# Patient Record
Sex: Female | Born: 1960 | Race: White | Hispanic: No | Marital: Married | Smoking: Former smoker
Health system: Southern US, Community
[De-identification: ages and names within clinical notes are randomized; demographics above are authoritative.]

## PROBLEM LIST (undated history)

## (undated) DIAGNOSIS — M810 Age-related osteoporosis without current pathological fracture: Secondary | ICD-10-CM

## (undated) DIAGNOSIS — C50919 Malignant neoplasm of unspecified site of unspecified female breast: Secondary | ICD-10-CM

## (undated) DIAGNOSIS — K8020 Calculus of gallbladder without cholecystitis without obstruction: Secondary | ICD-10-CM

## (undated) DIAGNOSIS — F410 Panic disorder [episodic paroxysmal anxiety] without agoraphobia: Secondary | ICD-10-CM

## (undated) DIAGNOSIS — Z9221 Personal history of antineoplastic chemotherapy: Secondary | ICD-10-CM

## (undated) DIAGNOSIS — F419 Anxiety disorder, unspecified: Secondary | ICD-10-CM

## (undated) DIAGNOSIS — W19XXXA Unspecified fall, initial encounter: Secondary | ICD-10-CM

## (undated) DIAGNOSIS — R42 Dizziness and giddiness: Secondary | ICD-10-CM

## (undated) HISTORY — DX: Unspecified fall, initial encounter: W19.XXXA

## (undated) HISTORY — DX: Calculus of gallbladder without cholecystitis without obstruction: K80.20

## (undated) HISTORY — DX: Age-related osteoporosis without current pathological fracture: M81.0

## (undated) HISTORY — PX: OTHER SURGICAL HISTORY: SHX169

## (undated) HISTORY — DX: Malignant neoplasm of unspecified site of unspecified female breast: C50.919

## (undated) HISTORY — DX: Anxiety disorder, unspecified: F41.9

## (undated) HISTORY — PX: COLONOSCOPY W/ POLYPECTOMY: SHX1380

---

## 1999-03-14 ENCOUNTER — Other Ambulatory Visit: Admission: RE | Admit: 1999-03-14 | Discharge: 1999-03-14 | Payer: Self-pay | Admitting: Obstetrics and Gynecology

## 1999-09-25 HISTORY — PX: TUBAL LIGATION: SHX77

## 1999-10-25 ENCOUNTER — Inpatient Hospital Stay (HOSPITAL_COMMUNITY): Admission: AD | Admit: 1999-10-25 | Discharge: 1999-10-28 | Payer: Self-pay | Admitting: Family Medicine

## 1999-10-29 ENCOUNTER — Encounter: Admission: RE | Admit: 1999-10-29 | Discharge: 2000-01-27 | Payer: Self-pay | Admitting: Obstetrics and Gynecology

## 2000-02-26 ENCOUNTER — Encounter (HOSPITAL_COMMUNITY): Admission: RE | Admit: 2000-02-26 | Discharge: 2000-05-26 | Payer: Self-pay | Admitting: Obstetrics and Gynecology

## 2000-04-02 ENCOUNTER — Other Ambulatory Visit: Admission: RE | Admit: 2000-04-02 | Discharge: 2000-04-02 | Payer: Self-pay | Admitting: Obstetrics and Gynecology

## 2000-04-04 ENCOUNTER — Encounter: Payer: Self-pay | Admitting: Obstetrics and Gynecology

## 2000-04-04 ENCOUNTER — Encounter: Admission: RE | Admit: 2000-04-04 | Discharge: 2000-04-04 | Payer: Self-pay | Admitting: Obstetrics and Gynecology

## 2000-09-24 DIAGNOSIS — C50919 Malignant neoplasm of unspecified site of unspecified female breast: Secondary | ICD-10-CM

## 2000-09-24 HISTORY — PX: MASTECTOMY: SHX3

## 2000-09-24 HISTORY — PX: AUGMENTATION MAMMAPLASTY: SUR837

## 2000-09-24 HISTORY — DX: Malignant neoplasm of unspecified site of unspecified female breast: C50.919

## 2000-09-24 HISTORY — PX: OTHER SURGICAL HISTORY: SHX169

## 2000-09-24 HISTORY — PX: BREAST BIOPSY: SHX20

## 2000-09-24 HISTORY — PX: ABDOMINAL HYSTERECTOMY: SHX81

## 2000-12-30 ENCOUNTER — Encounter: Admission: RE | Admit: 2000-12-30 | Discharge: 2000-12-30 | Payer: Self-pay | Admitting: Obstetrics and Gynecology

## 2000-12-30 ENCOUNTER — Encounter: Payer: Self-pay | Admitting: Obstetrics and Gynecology

## 2000-12-30 ENCOUNTER — Other Ambulatory Visit: Admission: RE | Admit: 2000-12-30 | Discharge: 2000-12-30 | Payer: Self-pay | Admitting: Obstetrics and Gynecology

## 2001-01-06 ENCOUNTER — Encounter: Payer: Self-pay | Admitting: General Surgery

## 2001-01-07 ENCOUNTER — Ambulatory Visit (HOSPITAL_COMMUNITY): Admission: RE | Admit: 2001-01-07 | Discharge: 2001-01-08 | Payer: Self-pay | Admitting: General Surgery

## 2001-01-07 ENCOUNTER — Encounter: Admission: RE | Admit: 2001-01-07 | Discharge: 2001-01-07 | Payer: Self-pay | Admitting: General Surgery

## 2001-01-07 ENCOUNTER — Encounter: Payer: Self-pay | Admitting: General Surgery

## 2001-01-08 HISTORY — PX: BREAST LUMPECTOMY: SHX2

## 2001-01-13 ENCOUNTER — Encounter: Admission: RE | Admit: 2001-01-13 | Discharge: 2001-04-13 | Payer: Self-pay | Admitting: *Deleted

## 2001-01-18 ENCOUNTER — Emergency Department (HOSPITAL_COMMUNITY): Admission: EM | Admit: 2001-01-18 | Discharge: 2001-01-18 | Payer: Self-pay | Admitting: Emergency Medicine

## 2001-02-03 ENCOUNTER — Ambulatory Visit (HOSPITAL_COMMUNITY): Admission: RE | Admit: 2001-02-03 | Discharge: 2001-02-03 | Payer: Self-pay | Admitting: Oncology

## 2001-02-03 ENCOUNTER — Encounter: Payer: Self-pay | Admitting: Oncology

## 2001-02-04 ENCOUNTER — Other Ambulatory Visit: Admission: RE | Admit: 2001-02-04 | Discharge: 2001-02-04 | Payer: Self-pay | Admitting: Obstetrics and Gynecology

## 2001-02-10 ENCOUNTER — Encounter: Payer: Self-pay | Admitting: Oncology

## 2001-02-10 ENCOUNTER — Ambulatory Visit (HOSPITAL_COMMUNITY): Admission: RE | Admit: 2001-02-10 | Discharge: 2001-02-10 | Payer: Self-pay | Admitting: Oncology

## 2001-02-18 ENCOUNTER — Ambulatory Visit (HOSPITAL_COMMUNITY): Admission: RE | Admit: 2001-02-18 | Discharge: 2001-02-18 | Payer: Self-pay | Admitting: General Surgery

## 2001-02-18 ENCOUNTER — Encounter: Payer: Self-pay | Admitting: General Surgery

## 2001-03-05 ENCOUNTER — Encounter: Payer: Self-pay | Admitting: Oncology

## 2001-03-05 ENCOUNTER — Ambulatory Visit (HOSPITAL_COMMUNITY): Admission: RE | Admit: 2001-03-05 | Discharge: 2001-03-05 | Payer: Self-pay | Admitting: Oncology

## 2001-03-25 ENCOUNTER — Emergency Department (HOSPITAL_COMMUNITY): Admission: EM | Admit: 2001-03-25 | Discharge: 2001-03-25 | Payer: Self-pay | Admitting: Emergency Medicine

## 2001-04-08 ENCOUNTER — Encounter: Payer: Self-pay | Admitting: Oncology

## 2001-04-08 ENCOUNTER — Ambulatory Visit (HOSPITAL_COMMUNITY): Admission: RE | Admit: 2001-04-08 | Discharge: 2001-04-08 | Payer: Self-pay | Admitting: Oncology

## 2001-06-09 ENCOUNTER — Inpatient Hospital Stay (HOSPITAL_COMMUNITY): Admission: RE | Admit: 2001-06-09 | Discharge: 2001-06-11 | Payer: Self-pay | Admitting: General Surgery

## 2001-08-26 ENCOUNTER — Inpatient Hospital Stay (HOSPITAL_COMMUNITY): Admission: RE | Admit: 2001-08-26 | Discharge: 2001-08-29 | Payer: Self-pay | Admitting: Obstetrics and Gynecology

## 2001-09-24 HISTORY — PX: BREAST ENHANCEMENT SURGERY: SHX7

## 2001-12-10 ENCOUNTER — Ambulatory Visit (HOSPITAL_BASED_OUTPATIENT_CLINIC_OR_DEPARTMENT_OTHER): Admission: RE | Admit: 2001-12-10 | Discharge: 2001-12-10 | Payer: Self-pay | Admitting: Plastic Surgery

## 2002-01-29 ENCOUNTER — Encounter: Payer: Self-pay | Admitting: Obstetrics and Gynecology

## 2002-01-29 ENCOUNTER — Encounter: Admission: RE | Admit: 2002-01-29 | Discharge: 2002-01-29 | Payer: Self-pay | Admitting: Obstetrics and Gynecology

## 2002-04-27 ENCOUNTER — Encounter: Admission: RE | Admit: 2002-04-27 | Discharge: 2002-05-28 | Payer: Self-pay | Admitting: Obstetrics and Gynecology

## 2003-03-03 ENCOUNTER — Encounter: Admission: RE | Admit: 2003-03-03 | Discharge: 2003-06-01 | Payer: Self-pay | Admitting: Obstetrics and Gynecology

## 2003-12-15 ENCOUNTER — Other Ambulatory Visit: Admission: RE | Admit: 2003-12-15 | Discharge: 2003-12-15 | Payer: Self-pay | Admitting: Obstetrics and Gynecology

## 2005-02-14 ENCOUNTER — Other Ambulatory Visit: Admission: RE | Admit: 2005-02-14 | Discharge: 2005-02-14 | Payer: Self-pay | Admitting: Obstetrics and Gynecology

## 2005-03-01 ENCOUNTER — Ambulatory Visit: Payer: Self-pay | Admitting: Oncology

## 2005-03-07 ENCOUNTER — Encounter: Admission: RE | Admit: 2005-03-07 | Discharge: 2005-03-07 | Payer: Self-pay | Admitting: Oncology

## 2006-02-20 ENCOUNTER — Ambulatory Visit: Payer: Self-pay | Admitting: Oncology

## 2006-03-22 LAB — CBC WITH DIFFERENTIAL/PLATELET
BASO%: 0.8 % (ref 0.0–2.0)
Basophils Absolute: 0.1 10*3/uL (ref 0.0–0.1)
EOS%: 6.1 % (ref 0.0–7.0)
Eosinophils Absolute: 0.5 10*3/uL (ref 0.0–0.5)
HCT: 38.4 % (ref 34.8–46.6)
HGB: 13.2 g/dL (ref 11.6–15.9)
LYMPH%: 33.2 % (ref 14.0–48.0)
MCH: 30.8 pg (ref 26.0–34.0)
MCHC: 34.3 g/dL (ref 32.0–36.0)
MCV: 89.9 fL (ref 81.0–101.0)
MONO#: 0.6 10*3/uL (ref 0.1–0.9)
MONO%: 7.9 % (ref 0.0–13.0)
NEUT#: 4 10*3/uL (ref 1.5–6.5)
NEUT%: 52 % (ref 39.6–76.8)
Platelets: 335 10*3/uL (ref 145–400)
RBC: 4.27 10*6/uL (ref 3.70–5.32)
RDW: 14.2 % (ref 11.3–14.5)
WBC: 7.7 10*3/uL (ref 3.9–10.0)
lymph#: 2.5 10*3/uL (ref 0.9–3.3)

## 2006-03-22 LAB — COMPREHENSIVE METABOLIC PANEL
ALT: 10 U/L (ref 0–40)
AST: 14 U/L (ref 0–37)
Albumin: 4.7 g/dL (ref 3.5–5.2)
Alkaline Phosphatase: 73 U/L (ref 39–117)
BUN: 14 mg/dL (ref 6–23)
CO2: 28 mEq/L (ref 19–32)
Calcium: 9.5 mg/dL (ref 8.4–10.5)
Chloride: 103 mEq/L (ref 96–112)
Creatinine, Ser: 0.83 mg/dL (ref 0.40–1.20)
Glucose, Bld: 86 mg/dL (ref 70–99)
Potassium: 4.2 mEq/L (ref 3.5–5.3)
Sodium: 140 mEq/L (ref 135–145)
Total Bilirubin: 0.3 mg/dL (ref 0.3–1.2)
Total Protein: 6.7 g/dL (ref 6.0–8.3)

## 2006-04-25 ENCOUNTER — Ambulatory Visit: Payer: Self-pay | Admitting: Oncology

## 2012-02-27 ENCOUNTER — Other Ambulatory Visit: Payer: Self-pay | Admitting: Obstetrics and Gynecology

## 2012-03-31 ENCOUNTER — Encounter: Payer: Self-pay | Admitting: Gastroenterology

## 2012-04-16 ENCOUNTER — Ambulatory Visit (AMBULATORY_SURGERY_CENTER): Payer: 59 | Admitting: *Deleted

## 2012-04-16 ENCOUNTER — Encounter: Payer: Self-pay | Admitting: Gastroenterology

## 2012-04-16 VITALS — Ht 64.0 in | Wt 188.0 lb

## 2012-04-16 DIAGNOSIS — Z1211 Encounter for screening for malignant neoplasm of colon: Secondary | ICD-10-CM

## 2012-04-16 MED ORDER — MOVIPREP 100 G PO SOLR: ORAL | Status: DC

## 2012-04-30 ENCOUNTER — Encounter: Payer: Self-pay | Admitting: Gastroenterology

## 2012-04-30 ENCOUNTER — Ambulatory Visit (AMBULATORY_SURGERY_CENTER): Payer: 59 | Admitting: Gastroenterology

## 2012-04-30 VITALS — BP 165/71 | HR 72 | Temp 97.0°F | Resp 18 | Ht 64.0 in | Wt 188.0 lb

## 2012-04-30 DIAGNOSIS — D126 Benign neoplasm of colon, unspecified: Secondary | ICD-10-CM

## 2012-04-30 DIAGNOSIS — Z1211 Encounter for screening for malignant neoplasm of colon: Secondary | ICD-10-CM

## 2012-04-30 MED ORDER — SODIUM CHLORIDE 0.9 % IV SOLN: 500.0000 mL | INTRAVENOUS | Status: DC

## 2012-04-30 NOTE — Patient Instructions (Signed)
YOU HAD AN ENDOSCOPIC PROCEDURE TODAY AT THE [facility]: Refer to the procedure report that was given to you for any specific questions about what was found during the examination.  If the procedure report does not answer your questions, please call your gastroenterologist to clarify.  If you requested that your care partner not be given the details of your procedure findings, then the procedure report has been included in a sealed envelope for you to review at your convenience later.  YOU SHOULD EXPECT: Some feelings of bloating in the abdomen. Passage of more gas than usual.  Walking can help get rid of the air that was put into your GI tract during the procedure and reduce the bloating. If you had a lower endoscopy (such as a colonoscopy or flexible sigmoidoscopy) you may notice spotting of blood in your stool or on the toilet paper. If you underwent a bowel prep for your procedure, then you may not have a normal bowel movement for a few days.  DIET: Your first meal following the procedure should be a light meal and then it is ok to progress to your normal diet.  A half-sandwich or bowl of soup is an example of a good first meal.  Heavy or fried foods are harder to digest and may make you feel nauseous or bloated.  Likewise meals heavy in dairy and vegetables can cause extra gas to form and this can also increase the bloating.  Drink plenty of fluids but you should avoid alcoholic beverages for 24 hours.  ACTIVITY: Your care partner should take you home directly after the procedure.  You should plan to take it easy, moving slowly for the rest of the day.  You can resume normal activity the day after the procedure however you should NOT DRIVE or use heavy machinery for 24 hours (because of the sedation medicines used during the test).    SYMPTOMS TO REPORT IMMEDIATELY: A gastroenterologist can be reached at any hour.  During normal business hours, 8:30 AM to 5:00 PM Monday through Friday,  call [phone].  After hours and on weekends, please call the GI answering service at [phone] who will take a message and have the physician on call contact you.   Following lower endoscopy (colonoscopy or flexible sigmoidoscopy):  Excessive amounts of blood in the stool  Significant tenderness or worsening of abdominal pains  Swelling of the abdomen that is new, acute  Fever of 100F or higher    FOLLOW UP: If any biopsies were taken you will be contacted by phone or by letter within the next 1-3 weeks.  Call your gastroenterologist if you have not heard about the biopsies in 3 weeks.  Our staff will call the home number listed on your records the next business day following your procedure to check on you and address any questions or concerns that you may have at that time regarding the information given to you following your procedure. This is a courtesy call and so if there is no answer at the home number and we have not heard from you through the emergency physician on call, we will assume that you have returned to your regular daily activities without incident.  SIGNATURES/CONFIDENTIALITY: You and/or your care partner have signed paperwork which will be entered into your electronic medical record.  These signatures attest to the fact that that the information above on your After Visit Summary has been reviewed and is understood.  Full responsibility of the confidentiality   of this discharge information lies with you and/or your care-partner.    INFORMATION ON POLYPS GIVEN TO YOU TODAY.   HOLD ASPIRIN & ANT INFLAMMATORY PRODUCTS FOR 2 WEEKS

## 2012-04-30 NOTE — Op Note (Signed)
[facility] [address] [place], [place]  [address]  COLONOSCOPY PROCEDURE REPORT  PATIENT:  Connie, Ochoa  MR#:  [id number] BIRTHDATE:  1961/02/21, 51 yrs. old  GENDER:  female ENDOSCOPIST:  Judie Petit T. Russella Dar, MD, [facility] Referred by:  Miguel Aschoff, M.D. PROCEDURE DATE:  04/30/2012 PROCEDURE:  Colonoscopy with snare polypectomy ASA CLASS:  Class II INDICATIONS:  1) Routine Risk Screening MEDICATIONS:   MAC sedation, administered by CRNA, propofol (Diprivan) 500 mg IV DESCRIPTION OF PROCEDURE:   After the risks benefits and alternatives of the procedure were thoroughly explained, informed consent was obtained.  Digital rectal exam was performed and revealed no abnormalities.   The LB CF-[id number] endoscope was introduced through the anus and advanced to the cecum, which was identified by both the appendix and ileocecal valve, without limitations.  The quality of the prep was excellent, using MoviPrep.  The instrument was then slowly withdrawn as the colon was fully examined. <<PROCEDUREIMAGES>> FINDINGS:  A sessile polyp was found in the cecum. It was 10 mm in size. Polyp was snared, then cauterized with monopolar cautery. Retrieval was successful. Piecemeal polypectomy. Otherwise normal colonoscopy without other polyps, masses, vascular ectasias, or inflammatory changes.   Retroflexed views in the rectum revealed no abnormalities.  The time to cecum =  2.75  minutes. The scope was then withdrawn (time =  9  min) from the patient and the procedure completed.  COMPLICATIONS:  None  ENDOSCOPIC IMPRESSION: 1) 10 mm sessile polyp in the cecum  RECOMMENDATIONS: 1) Await pathology results 2) Hold aspirin, aspirin products, and anti-inflammatory medication for 2 weeks. 3) Repeat Colonoscopy in 1 year, if polyp is adenomatous, to assess for complete polypectomy, otherwise 10 years.  Venita Lick. Russella Dar, MD, [facility]  n. eSIGNED:   Venita Lick. Lyric Rossano at 04/30/2012 03:05  PM  Almon Hercules, [phone]

## 2012-04-30 NOTE — Progress Notes (Signed)
Patient did not experience any of the following events: a burn prior to discharge; a fall within the facility; wrong site/side/patient/procedure/implant event; or a hospital transfer or hospital admission upon discharge from the facility. (G8907) Patient did not have preoperative order for IV antibiotic SSI prophylaxis. (G8918)  

## 2012-05-01 ENCOUNTER — Telehealth: Payer: Self-pay

## 2012-05-01 NOTE — Telephone Encounter (Signed)
  Follow up Call-  Call back number 04/30/2012  Post procedure Call Back phone  # cell [phone]  Permission to leave phone message Yes     Patient questions:  Do you have a fever, pain , or abdominal swelling? no Pain Score  0 *  Have you tolerated food without any problems? yes  Have you been able to return to your normal activities? yes  Do you have any questions about your discharge instructions: Diet   no Medications  no Follow up visit  no  Do you have questions or concerns about your Care? no  Actions: * If pain score is 4 or above: No action needed, pain <4.

## 2012-05-08 ENCOUNTER — Encounter: Payer: Self-pay | Admitting: Gastroenterology

## 2013-01-20 ENCOUNTER — Telehealth: Payer: Self-pay | Admitting: *Deleted

## 2013-01-20 NOTE — Telephone Encounter (Signed)
Left message for pt to return my call so I can schedule an appt w/ Dr. Welton Flakes.

## 2013-01-23 ENCOUNTER — Telehealth: Payer: Self-pay | Admitting: *Deleted

## 2013-01-23 NOTE — Telephone Encounter (Signed)
Left message for pt to return my call so I can schedule her an appt w/ Dr. Welton Flakes.

## 2013-02-10 ENCOUNTER — Telehealth: Payer: Self-pay | Admitting: *Deleted

## 2013-02-10 NOTE — Telephone Encounter (Signed)
Left message for pt to return my call so I can schedule her an appt w/ Dr. Khan. 

## 2013-03-05 ENCOUNTER — Other Ambulatory Visit: Payer: Self-pay | Admitting: Obstetrics and Gynecology

## 2013-05-13 ENCOUNTER — Other Ambulatory Visit: Payer: Self-pay | Admitting: Hematology & Oncology

## 2013-06-03 ENCOUNTER — Encounter: Payer: Self-pay | Admitting: Gastroenterology

## 2013-09-07 ENCOUNTER — Encounter (HOSPITAL_COMMUNITY): Payer: Self-pay | Admitting: Emergency Medicine

## 2013-09-07 ENCOUNTER — Emergency Department (HOSPITAL_COMMUNITY)
Admission: EM | Admit: 2013-09-07 | Discharge: 2013-09-08 | Disposition: A | Discharge status: Home / Self Care | Payer: 59 | Attending: Emergency Medicine | Admitting: Emergency Medicine

## 2013-09-07 DIAGNOSIS — R109 Unspecified abdominal pain: Secondary | ICD-10-CM

## 2013-09-07 DIAGNOSIS — K8020 Calculus of gallbladder without cholecystitis without obstruction: Secondary | ICD-10-CM | POA: Insufficient documentation

## 2013-09-07 DIAGNOSIS — Z853 Personal history of malignant neoplasm of breast: Secondary | ICD-10-CM | POA: Insufficient documentation

## 2013-09-07 DIAGNOSIS — Z90710 Acquired absence of both cervix and uterus: Secondary | ICD-10-CM | POA: Insufficient documentation

## 2013-09-07 DIAGNOSIS — Z87891 Personal history of nicotine dependence: Secondary | ICD-10-CM | POA: Insufficient documentation

## 2013-09-07 DIAGNOSIS — Z9889 Other specified postprocedural states: Secondary | ICD-10-CM | POA: Insufficient documentation

## 2013-09-07 DIAGNOSIS — Z9851 Tubal ligation status: Secondary | ICD-10-CM | POA: Insufficient documentation

## 2013-09-07 NOTE — ED Notes (Signed)
Pt reports she began having upper abdominal pain at 1900 once she got home from work, states she ate lunch at 1300 without difficulty, pt reports abdominal distention, denies n/v/d, states she took Tums but this did not relieve the pain or pressure

## 2013-09-08 ENCOUNTER — Emergency Department (HOSPITAL_COMMUNITY): Payer: 59

## 2013-09-08 LAB — CBC WITH DIFFERENTIAL/PLATELET
Basophils Absolute: 0 10*3/uL (ref 0.0–0.1)
Basophils Relative: 0 % (ref 0–1)
Eosinophils Absolute: 0.1 10*3/uL (ref 0.0–0.7)
Eosinophils Relative: 2 % (ref 0–5)
HCT: 38.8 % (ref 36.0–46.0)
Hemoglobin: 13.1 g/dL (ref 12.0–15.0)
Lymphocytes Relative: 21 % (ref 12–46)
Lymphs Abs: 2 10*3/uL (ref 0.7–4.0)
MCH: 30.3 pg (ref 26.0–34.0)
MCHC: 33.8 g/dL (ref 30.0–36.0)
MCV: 89.6 fL (ref 78.0–100.0)
Monocytes Absolute: 0.8 10*3/uL (ref 0.1–1.0)
Monocytes Relative: 8 % (ref 3–12)
Neutro Abs: 6.5 10*3/uL (ref 1.7–7.7)
Neutrophils Relative %: 69 % (ref 43–77)
Platelets: 253 10*3/uL (ref 150–400)
RBC: 4.33 MIL/uL (ref 3.87–5.11)
RDW: 14.1 % (ref 11.5–15.5)
WBC: 9.4 10*3/uL (ref 4.0–10.5)

## 2013-09-08 LAB — URINALYSIS, ROUTINE W REFLEX MICROSCOPIC
Bilirubin Urine: NEGATIVE
Glucose, UA: NEGATIVE mg/dL
Hgb urine dipstick: NEGATIVE
Ketones, ur: NEGATIVE mg/dL
Nitrite: NEGATIVE
Protein, ur: NEGATIVE mg/dL
Specific Gravity, Urine: 1.025 (ref 1.005–1.030)
Urobilinogen, UA: 1 mg/dL (ref 0.0–1.0)
pH: 6.5 (ref 5.0–8.0)

## 2013-09-08 LAB — COMPREHENSIVE METABOLIC PANEL
ALT: 59 U/L — ABNORMAL HIGH (ref 0–35)
AST: 136 U/L — ABNORMAL HIGH (ref 0–37)
Albumin: 3.9 g/dL (ref 3.5–5.2)
Alkaline Phosphatase: 81 U/L (ref 39–117)
BUN: 11 mg/dL (ref 6–23)
CO2: 29 mEq/L (ref 19–32)
Calcium: 10.8 mg/dL — ABNORMAL HIGH (ref 8.4–10.5)
Chloride: 99 mEq/L (ref 96–112)
Creatinine, Ser: 0.79 mg/dL (ref 0.50–1.10)
GFR calc Af Amer: >90 mL/min (ref >90)
GFR calc non Af Amer: >90 mL/min (ref >90)
Glucose, Bld: 134 mg/dL — ABNORMAL HIGH (ref 70–99)
Potassium: 3.8 mEq/L (ref 3.5–5.1)
Sodium: 138 mEq/L (ref 135–145)
Total Bilirubin: 0.3 mg/dL (ref 0.3–1.2)
Total Protein: 6.7 g/dL (ref 6.0–8.3)

## 2013-09-08 LAB — URINE MICROSCOPIC-ADD ON

## 2013-09-08 LAB — LIPASE, BLOOD: Lipase: 51 U/L (ref 11–59)

## 2013-09-08 MED ORDER — HYDROCODONE-ACETAMINOPHEN 5-325 MG PO TABS
1.0000 | ORAL_TABLET | Freq: Once | ORAL | Status: AC
  Administered 2013-09-08: 1 via ORAL
  Filled 2013-09-08: qty 1

## 2013-09-08 MED ORDER — HYDROCODONE-ACETAMINOPHEN 5-325 MG PO TABS: 1.0000 | ORAL_TABLET | ORAL | Status: DC | PRN

## 2013-09-08 MED ORDER — GI COCKTAIL ~~LOC~~
30.0000 mL | Freq: Once | ORAL | Status: AC
  Administered 2013-09-08: 30 mL via ORAL
  Filled 2013-09-08: qty 30

## 2013-09-08 MED ORDER — ONDANSETRON 8 MG PO TBDP
8.0000 mg | ORAL_TABLET | Freq: Once | ORAL | Status: AC
  Administered 2013-09-08: 8 mg via ORAL
  Filled 2013-09-08: qty 1

## 2013-09-08 NOTE — ED Provider Notes (Signed)
Medical screening examination/treatment/procedure(s) were performed by non-physician practitioner and as supervising physician I was immediately available for consultation/collaboration.    Sunnie Nielsen, MD 09/08/13 [phone]

## 2013-09-08 NOTE — ED Notes (Signed)
US at bedside

## 2013-09-08 NOTE — ED Provider Notes (Signed)
CSN: [id number]     Arrival date & time 09/07/13  2238 History   First MD Initiated Contact with Patient 09/08/13 0152     Chief Complaint  Patient presents with  . Abdominal Pain   HPI  History provided by the patient and husband. Patient is a 52 year old female with previous history of breast cancer, mastectomy, C-section, hysterectomy who presents with complaints of continued and slightly worsened upper abdominal pain. Patient states she first began having some pain and discomfort in the epigastric and upper abdominal areas around 7 PM. Patient states she was returning home at the time. Penis is sore and cramping pain that radiates both to the left and right sides. She denies pain around to the back. She did return home and tried to take some TUMS without any relief of symptoms. She denies having any associated fever, chills or sweats. No nausea, vomiting or diarrhea symptoms. Her last meal was around 1 PM and she has not eaten anything since that time. Denies any similar symptoms previously. No other aggravating or alleviating factors. No other associated symptoms.   Past Medical History  Diagnosis Date  . Breast cancer 2002   Past Surgical History  Procedure Laterality Date  . Bilateral mastectomy  2002  . C section  1997 2001    x2  . Tubal ligation  2001  . Abdominal hysterectomy  2002  . Breast enhancement surgery  2003    bilateral  . Colonscopy     Family History  Problem Relation Age of Onset  . Uterine cancer Mother   . Ovarian cancer Mother   . Breast cancer Sister   . Esophageal cancer Brother   . Breast cancer Maternal Grandmother    History  Substance Use Topics  . Smoking status: Former Smoker    Quit date: 04/17/1979  . Smokeless tobacco: Not on file  . Alcohol Use: 0.6 oz/week    1 Cans of beer per week   OB History   Grav Para Term Preterm Abortions TAB SAB Ect Mult Living                 Review of Systems  Constitutional: Negative for fever,  chills and diaphoresis.  Respiratory: Negative for cough and shortness of breath.   Cardiovascular: Negative for chest pain.  Gastrointestinal: Positive for abdominal pain. Negative for nausea, vomiting, diarrhea, constipation and blood in stool.  Genitourinary: Negative for dysuria, frequency, hematuria and flank pain.  All other systems reviewed and are negative.    Allergies  Review of patient's allergies indicates no known allergies.  Home Medications  No current outpatient prescriptions on file. BP 98/73  Pulse 77  Temp(Src) 98.7 F (37.1 C) (Oral)  Resp 21  SpO2 96% Physical Exam  Nursing note and vitals reviewed. Constitutional: She is oriented to person, place, and time. She appears well-developed and well-nourished. No distress.  HENT:  Head: Normocephalic.  Cardiovascular: Normal rate and regular rhythm.   Pulmonary/Chest: Effort normal and breath sounds normal.  Abdominal: Soft.  Neurological: She is alert and oriented to person, place, and time.  Skin: Skin is warm and dry. No rash noted.  Psychiatric: She has a normal mood and affect. Her behavior is normal.    ED Course  Procedures   DIAGNOSTIC STUDIES: Oxygen Saturation is 96% on room air.    COORDINATION OF CARE:  Nursing notes reviewed. Vital signs reviewed. Initial pt interview and examination performed.   1:58 AM-patient seen and evaluated. She appears  in mild discomfort but no acute distress. He does not appear severely ill or toxic. Discussed work up plan with pt at bedside, which includes lab testing and ultrasound. Pt agrees with plan.  Ultrasound demonstrates several small gallstones. No significant gallbladder wall thickening or stranding. Patient reports having improvement of symptoms after medicine. Patient was requesting to return home. I discussed the need for her to followup with the general surgeon later today or with her primary care provider to schedule close followup and reevaluation. I  also gave strict return precautions and patient agreed.  Treatment plan initiated: Medications  gi cocktail (Maalox,Lidocaine,Donnatal) (not administered)  HYDROcodone-acetaminophen (NORCO/VICODIN) 5-325 MG per tablet 1 tablet (1 tablet Oral Given 09/08/13 0240)  ondansetron (ZOFRAN-ODT) disintegrating tablet 8 mg (8 mg Oral Given 09/08/13 0240)   Results for orders placed during the hospital encounter of 09/07/13  CBC WITH DIFFERENTIAL      Result Value Range   WBC 9.4  4.0 - 10.5 K/uL   RBC 4.33  3.87 - 5.11 MIL/uL   Hemoglobin 13.1  12.0 - 15.0 g/dL   HCT 91.4  78.2 - 95.6 %   MCV 89.6  78.0 - 100.0 fL   MCH 30.3  26.0 - 34.0 pg   MCHC 33.8  30.0 - 36.0 g/dL   RDW 21.3  08.6 - 57.8 %   Platelets 253  150 - 400 K/uL   Neutrophils Relative % 69  43 - 77 %   Neutro Abs 6.5  1.7 - 7.7 K/uL   Lymphocytes Relative 21  12 - 46 %   Lymphs Abs 2.0  0.7 - 4.0 K/uL   Monocytes Relative 8  3 - 12 %   Monocytes Absolute 0.8  0.1 - 1.0 K/uL   Eosinophils Relative 2  0 - 5 %   Eosinophils Absolute 0.1  0.0 - 0.7 K/uL   Basophils Relative 0  0 - 1 %   Basophils Absolute 0.0  0.0 - 0.1 K/uL  COMPREHENSIVE METABOLIC PANEL      Result Value Range   Sodium 138  135 - 145 mEq/L   Potassium 3.8  3.5 - 5.1 mEq/L   Chloride 99  96 - 112 mEq/L   CO2 29  19 - 32 mEq/L   Glucose, Bld 134 (*) 70 - 99 mg/dL   BUN 11  6 - 23 mg/dL   Creatinine, Ser 4.69  0.50 - 1.10 mg/dL   Calcium 62.9 (*) 8.4 - 10.5 mg/dL   Total Protein 6.7  6.0 - 8.3 g/dL   Albumin 3.9  3.5 - 5.2 g/dL   AST 528 (*) 0 - 37 U/L   ALT 59 (*) 0 - 35 U/L   Alkaline Phosphatase 81  39 - 117 U/L   Total Bilirubin 0.3  0.3 - 1.2 mg/dL   GFR calc non Af Amer >90  >90 mL/min   GFR calc Af Amer >90  >90 mL/min  LIPASE, BLOOD      Result Value Range   Lipase 51  11 - 59 U/L  URINALYSIS, ROUTINE W REFLEX MICROSCOPIC      Result Value Range   Color, Urine YELLOW  YELLOW   APPearance CLEAR  CLEAR   Specific Gravity, Urine 1.025   1.005 - 1.030   pH 6.5  5.0 - 8.0   Glucose, UA NEGATIVE  NEGATIVE mg/dL   Hgb urine dipstick NEGATIVE  NEGATIVE   Bilirubin Urine NEGATIVE  NEGATIVE   Ketones, ur NEGATIVE  NEGATIVE mg/dL   Protein, ur NEGATIVE  NEGATIVE mg/dL   Urobilinogen, UA 1.0  0.0 - 1.0 mg/dL   Nitrite NEGATIVE  NEGATIVE   Leukocytes, UA SMALL (*) NEGATIVE  URINE MICROSCOPIC-ADD ON      Result Value Range   WBC, UA 0-2  <3 WBC/hpf   Bacteria, UA FEW (*) RARE      Imaging Review US Abdomen Complete  09/08/2013   CLINICAL DATA:  Right upper quadrant pain  EXAM: ULTRASOUND ABDOMEN COMPLETE  COMPARISON:  None.  FINDINGS: Gallbladder:  Numerous small stones. 3 mm wall thickness, borderline abnormal, but no sonographic Murphy sign or gallbladder distention.  Common bile duct:  Diameter: 4 mm.  Liver:  No focal lesion identified. Within normal limits in parenchymal echogenicity.  IVC:  No abnormality visualized.  Pancreas:  Visualized portion unremarkable.  Spleen:  Size and appearance within normal limits.  Right Kidney:  Length: 10 cm. Echogenicity within normal limits. No mass or hydronephrosis visualized.  Left Kidney:  Length: 9.5 cm. Echogenicity within normal limits. No mass or hydronephrosis visualized.  Abdominal aorta:  No aneurysm visualized.  Other findings:  None.  IMPRESSION: Numerous gallstones. No focal gallbladder tenderness to suggest acute cholecystitis.   Electronically Signed   By: Tiburcio Pea M.D.   On: 09/08/2013 02:16      MDM   1. Abdominal pain   2. Cholelithiasis        Angus Seller, PA-C 09/08/13 0300

## 2013-10-09 ENCOUNTER — Other Ambulatory Visit: Payer: Self-pay | Admitting: Hematology & Oncology

## 2013-12-02 ENCOUNTER — Encounter: Payer: Self-pay | Admitting: Gastroenterology

## 2013-12-09 ENCOUNTER — Ambulatory Visit (AMBULATORY_SURGERY_CENTER): Payer: Self-pay | Admitting: *Deleted

## 2013-12-09 VITALS — Ht 64.0 in | Wt 152.0 lb

## 2013-12-09 DIAGNOSIS — Z86010 Personal history of colonic polyps: Secondary | ICD-10-CM

## 2013-12-09 MED ORDER — MOVIPREP 100 G PO SOLR: ORAL | Status: DC

## 2013-12-09 NOTE — Progress Notes (Signed)
No allergies to eggs or soy. No problems with anesthesia.  

## 2013-12-14 ENCOUNTER — Encounter: Payer: Self-pay | Admitting: Gastroenterology

## 2013-12-15 ENCOUNTER — Ambulatory Visit (INDEPENDENT_AMBULATORY_CARE_PROVIDER_SITE_OTHER): Payer: 59 | Admitting: General Surgery

## 2013-12-31 ENCOUNTER — Encounter: Payer: 59 | Admitting: Gastroenterology

## 2014-01-13 ENCOUNTER — Ambulatory Visit (INDEPENDENT_AMBULATORY_CARE_PROVIDER_SITE_OTHER): Payer: 59 | Admitting: Surgery

## 2014-01-29 ENCOUNTER — Encounter (INDEPENDENT_AMBULATORY_CARE_PROVIDER_SITE_OTHER): Payer: Self-pay | Admitting: Surgery

## 2014-01-29 ENCOUNTER — Ambulatory Visit (INDEPENDENT_AMBULATORY_CARE_PROVIDER_SITE_OTHER): Payer: 59 | Admitting: Surgery

## 2014-01-29 VITALS — BP 115/65 | HR 64 | Temp 97.9°F | Resp 16 | Ht 64.0 in | Wt 152.4 lb

## 2014-01-29 DIAGNOSIS — K8010 Calculus of gallbladder with chronic cholecystitis without obstruction: Secondary | ICD-10-CM

## 2014-01-29 MED ORDER — OXYCODONE-ACETAMINOPHEN 5-325 MG PO TABS: 1.0000 | ORAL_TABLET | ORAL | Status: DC | PRN

## 2014-01-29 NOTE — Patient Instructions (Signed)
After your colonoscopy, call our surgery schedulers at [phone] to schedule your  Gallbladder surgery.

## 2014-01-29 NOTE — Progress Notes (Signed)
Patient ID: Connie Ochoa Ochoa, female   DOB: 1961/03/25, 52 y.o.   MRN: [id number]  Chief Complaint  Patient presents with  . Cholelithiasis    HPI Connie Ochoa Ochoa is a 53 y.o. female.  PCP - Dr. Drema Ochoa Referred for evaluation of abdominal pain/ gallstones HPI This is a 53 year old female who presents with intermittent epigastric and right upper quadrant abdominal pain that has been present for about 6 months. This has become more frequent and more severe. This tends to occur after eating. This is associated with significant abdominal bloating but no nausea, vomiting, or diarrhea. The patient was evaluated in the emergency department in December of 2014 was noted to have symptomatic gallstones but no sign of cholecystitis. She tried to manage her symptoms by limiting the amount of fat in her diet. However recently her symptoms have worsened. The patient is scheduled for a followup colonoscopy by Dr. Lucio Ochoa. She had a tubular adenoma and her cecum and 2013. She is scheduled soon for another colonoscopy.  Past Medical History  Diagnosis Date  . Breast cancer 2002  . Anxiety   . Gall stones     Past Surgical History  Procedure Laterality Date  . Bilateral mastectomy  2002  . C section  1997 2001    x2  . Tubal ligation  2001  . Abdominal hysterectomy  2002  . Breast enhancement surgery  2003    bilateral  . Colonscopy    . Breast biopsy Left 2002    Family History  Problem Relation Age of Onset  . Uterine cancer Mother   . Ovarian cancer Mother   . Breast cancer Sister   . Esophageal cancer Brother   . Breast cancer Maternal Grandmother   . Colon cancer Neg Hx     Social History History  Substance Use Topics  . Smoking status: Former Smoker    Quit date: 04/17/1979  . Smokeless tobacco: Never Used  . Alcohol Use: 0.6 oz/week    1 Cans of beer per week    No Known Allergies  Current Outpatient Prescriptions  Medication Sig Dispense Refill  . ALPRAZolam  (XANAX) 0.25 MG tablet       . sertraline (ZOLOFT) 25 MG tablet       . Vitamin D, Ergocalciferol, (DRISDOL) 50000 UNITS CAPS capsule       . MOVIPREP 100 G SOLR moviprep as directed. No substitutions  1 kit  0  . oxyCODONE-acetaminophen (PERCOCET/ROXICET) 5-325 MG per tablet Take 1 tablet by mouth every 4 (four) hours as needed for severe pain.  40 tablet  0   No current facility-administered medications for this visit.    Review of Systems Review of Systems  Constitutional: Negative for fever, chills and unexpected weight change.  HENT: Negative for congestion, hearing loss, sore throat, trouble swallowing and voice change.   Eyes: Negative for visual disturbance.  Respiratory: Negative for cough and wheezing.   Cardiovascular: Negative for chest pain, palpitations and leg swelling.  Gastrointestinal: Positive for abdominal pain and abdominal distention. Negative for nausea, vomiting, diarrhea, constipation, blood in stool and anal bleeding.  Genitourinary: Negative for hematuria, vaginal bleeding and difficulty urinating.  Musculoskeletal: Negative for arthralgias.  Skin: Negative for rash and wound.  Neurological: Negative for seizures, syncope and headaches.  Hematological: Negative for adenopathy. Does not bruise/bleed easily.  Psychiatric/Behavioral: Negative for confusion.    Blood pressure 115/65, pulse 64, temperature 97.9 F (36.6 C), resp. rate 16, height '5\' 4"'  (1.626 m),  weight 152 lb 6.4 oz (69.128 kg).  Physical Exam Physical Exam WDWN in NAD HEENT:  EOMI, sclera anicteric Neck:  No masses, no thyromegaly Lungs:  CTA bilaterally; normal respiratory effort CV:  Regular rate and rhythm; no murmurs Abd:  +bowel sounds, soft, mildly tender in epigastrium and RUQ Ext:  Well-perfused; no edema Skin:  Warm, dry; no sign of jaundice  Data Reviewed CLINICAL DATA: Right upper quadrant pain  EXAM:  ULTRASOUND ABDOMEN COMPLETE  COMPARISON: None.  FINDINGS:   Gallbladder:  Numerous small stones. 3 mm wall thickness, borderline abnormal, but  no sonographic Murphy sign or gallbladder distention.  Common bile duct:  Diameter: 4 mm.  Liver:  No focal lesion identified. Within normal limits in parenchymal  echogenicity.  IVC:  No abnormality visualized.  Pancreas:  Visualized portion unremarkable.  Spleen:  Size and appearance within normal limits.  Right Kidney:  Length: 10 cm. Echogenicity within normal limits. No mass or  hydronephrosis visualized.  Left Kidney:  Length: 9.5 cm. Echogenicity within normal limits. No mass or  hydronephrosis visualized.  Abdominal aorta:  No aneurysm visualized.  Other findings:  None.  IMPRESSION:  Numerous gallstones. No focal gallbladder tenderness to suggest  acute cholecystitis.  Electronically Signed  By: Connie Ochoa Ochoa M.D.  On: 09/08/2013 02:16 Lab Results  Component Value Date   WBC 9.4 09/08/2013   HGB 13.1 09/08/2013   HCT 38.8 09/08/2013   MCV 89.6 09/08/2013   PLT 253 09/08/2013   Lab Results  Component Value Date   CREATININE 0.79 09/08/2013   BUN 11 09/08/2013   NA 138 09/08/2013   K 3.8 09/08/2013   CL 99 09/08/2013   CO2 29 09/08/2013   Lab Results  Component Value Date   ALT 59* 09/08/2013   AST 136* 09/08/2013   ALKPHOS 81 09/08/2013   BILITOT 0.3 09/08/2013     Assessment    Chronic calculus cholecystitis     Plan    I recommend that the patient proceed with her colonoscopy.  After that is complete, recommend laparoscopic cholecystectomy with intraoperative cholangiogram.  The surgical procedure has been discussed with the patient.  Potential risks, benefits, alternative treatments, and expected outcomes have been explained.  All of the patient's questions at this time have been answered.  The likelihood of reaching the patient's treatment goal is good.  The patient understand the proposed surgical procedure and wishes to proceed.  She will call back to  schedule.         Connie Ochoa Ochoa. Connie Ochoa Ochoa 01/29/2014, 12:48 PM

## 2014-03-19 ENCOUNTER — Ambulatory Visit (AMBULATORY_SURGERY_CENTER): Payer: 59 | Admitting: Gastroenterology

## 2014-03-19 ENCOUNTER — Encounter: Payer: Self-pay | Admitting: Gastroenterology

## 2014-03-19 ENCOUNTER — Encounter: Payer: 59 | Admitting: Gastroenterology

## 2014-03-19 VITALS — BP 114/70 | HR 62 | Temp 98.0°F | Resp 27 | Ht 64.0 in | Wt 152.0 lb

## 2014-03-19 DIAGNOSIS — D126 Benign neoplasm of colon, unspecified: Secondary | ICD-10-CM

## 2014-03-19 DIAGNOSIS — Z86010 Personal history of colonic polyps: Secondary | ICD-10-CM

## 2014-03-19 MED ORDER — SODIUM CHLORIDE 0.9 % IV SOLN: 500.0000 mL | INTRAVENOUS | Status: DC

## 2014-03-19 NOTE — Progress Notes (Signed)
Called to room to assist during endoscopic procedure.  Patient ID and intended procedure confirmed with present staff. Received instructions for my participation in the procedure from the performing physician.  

## 2014-03-19 NOTE — Progress Notes (Signed)
A/ox3, pleased with MAC, report to RN 

## 2014-03-19 NOTE — Patient Instructions (Signed)
YOU HAD AN ENDOSCOPIC PROCEDURE TODAY AT THE [facility]: Refer to the procedure report that was given to you for any specific questions about what was found during the examination.  If the procedure report does not answer your questions, please call your gastroenterologist to clarify.  If you requested that your care partner not be given the details of your procedure findings, then the procedure report has been included in a sealed envelope for you to review at your convenience later.  YOU SHOULD EXPECT: Some feelings of bloating in the abdomen. Passage of more gas than usual.  Walking can help get rid of the air that was put into your GI tract during the procedure and reduce the bloating. If you had a lower endoscopy (such as a colonoscopy or flexible sigmoidoscopy) you may notice spotting of blood in your stool or on the toilet paper. If you underwent a bowel prep for your procedure, then you may not have a normal bowel movement for a few days.  DIET: Your first meal following the procedure should be a light meal and then it is ok to progress to your normal diet.  A half-sandwich or bowl of soup is an example of a good first meal.  Heavy or fried foods are harder to digest and may make you feel nauseous or bloated.  Likewise meals heavy in dairy and vegetables can cause extra gas to form and this can also increase the bloating.  Drink plenty of fluids but you should avoid alcoholic beverages for 24 hours.  ACTIVITY: Your care partner should take you home directly after the procedure.  You should plan to take it easy, moving slowly for the rest of the day.  You can resume normal activity the day after the procedure however you should NOT DRIVE or use heavy machinery for 24 hours (because of the sedation medicines used during the test).    SYMPTOMS TO REPORT IMMEDIATELY: A gastroenterologist can be reached at any hour.  During normal business hours, 8:30 AM to 5:00 PM Monday through Friday,  call [phone].  After hours and on weekends, please call the GI answering service at [phone] who will take a message and have the physician on call contact you.   Following lower endoscopy (colonoscopy or flexible sigmoidoscopy):  Excessive amounts of blood in the stool  Significant tenderness or worsening of abdominal pains  Swelling of the abdomen that is new, acute  Fever of 100F or higher  FOLLOW UP: If any biopsies were taken you will be contacted by phone or by letter within the next 1-3 weeks.  Call your gastroenterologist if you have not heard about the biopsies in 3 weeks.  Our staff will call the home number listed on your records the next business day following your procedure to check on you and address any questions or concerns that you may have at that time regarding the information given to you following your procedure. This is a courtesy call and so if there is no answer at the home number and we have not heard from you through the emergency physician on call, we will assume that you have returned to your regular daily activities without incident.  SIGNATURES/CONFIDENTIALITY: You and/or your care partner have signed paperwork which will be entered into your electronic medical record.  These signatures attest to the fact that that the information above on your After Visit Summary has been reviewed and is understood.  Full responsibility of the confidentiality of this   discharge information lies with you and/or your care-partner.  HOLD ANTI-INFLAMMATORIES FOR 2 WEEKS SUCH AS ASPIRIN, NSAIDS, ALEVE, IBUPROFEN DUE TO THE CAUTERY USSED FOR REMOVAL OF YOUR POLYP.  REPEAT COLONOSCOPY IN 3 YEARS.

## 2014-03-19 NOTE — Op Note (Signed)
[facility]  [address] [place], [address]   COLONOSCOPY PROCEDURE REPORT  PATIENT: Connie, Ochoa  MR#: [id number] BIRTHDATE: [birth year] , 19  yrs. old GENDER: Female ENDOSCOPIST: Ladene Artist, MD, [facility]  PROCEDURE DATE:  03/19/2014 PROCEDURE:   Colonoscopy with snare polypectomy First Screening Colonoscopy - Avg.  risk and is 50 yrs.  old or older - No.  Prior Negative Screening - Now for repeat screening. N/A  History of Adenoma - Now for follow-up colonoscopy & has been > or = to 3 yrs.  No.  It has been less than 3 yrs since last colonoscopy.  Medical reason.  Polyps Removed Today? Yes. ASA CLASS:   Class II INDICATIONS:Patient's personal history of adenomatous colon polyps, piecemeal polypectomy in 2013. MEDICATIONS: MAC sedation, administered by CRNA and propofol (Diprivan) 450mg  IV DESCRIPTION OF PROCEDURE:   After the risks benefits and alternatives of the procedure were thoroughly explained, informed consent was obtained.  A digital rectal exam revealed no abnormalities of the rectum.   The LB [id number]  endoscope was introduced through the anus and advanced to the cecum, which was identified by both the appendix and ileocecal valve. No adverse events experienced.   The quality of the prep was excellent, using MoviPrep  The instrument was then slowly withdrawn as the colon was fully examined.  COLON FINDINGS: A sessile polyp measuring 6 mm in size was found at the cecum.  A polypectomy was performed using snare cautery.  The resection was complete and the polyp tissue was completely retrieved.   The colon was otherwise normal.  There was no diverticulosis, inflammation, polyps or cancers unless previously stated.  Retroflexed views revealed no abnormalities. The time to cecum=4 minutes 56 seconds.  Withdrawal time=9 minutes 22 seconds. The scope was withdrawn and the procedure completed. COMPLICATIONS: There were no  complications.  ENDOSCOPIC IMPRESSION: 1.   Sessile polyp measuring 6 mm at the cecum; polypectomy performed using snare cautery 2.   The colon was otherwise normal  RECOMMENDATIONS: 1.  Hold aspirin, aspirin products, and anti-inflammatory medication for 2 weeks. 2.  Await pathology results 3.  Repeat Colonoscopy in 3 years.  eSigned:  Ladene Artist, MD, [facility] 03/19/2014 10:57 AM   cc: Leighton Ruff, MD

## 2014-03-19 NOTE — Progress Notes (Signed)
Dr. Fuller Plan made aware of pt's food intake on 03-18-14

## 2014-03-22 ENCOUNTER — Telehealth: Payer: Self-pay | Admitting: *Deleted

## 2014-03-22 NOTE — Telephone Encounter (Signed)
  Follow up Call-  Call back number 03/19/2014 04/30/2012  Post procedure Call Back phone  # [phone] cell [phone]  Permission to leave phone message Yes Yes     No answer, left message.

## 2014-03-28 ENCOUNTER — Encounter: Payer: Self-pay | Admitting: Gastroenterology

## 2014-07-07 ENCOUNTER — Other Ambulatory Visit (INDEPENDENT_AMBULATORY_CARE_PROVIDER_SITE_OTHER): Payer: Self-pay | Admitting: Surgery

## 2014-07-07 ENCOUNTER — Ambulatory Visit (INDEPENDENT_AMBULATORY_CARE_PROVIDER_SITE_OTHER): Payer: Self-pay | Admitting: Surgery

## 2014-07-07 NOTE — H&P (Signed)
Chief Complaint   Patient presents with   .  Cholelithiasis   HPI  Connie Ochoa is a 53 y.o. female. PCP - Dr. Drema [place]  Referred for evaluation of abdominal pain/ gallstones  HPI  This is a 53 year old female who presents with intermittent epigastric and right upper quadrant abdominal pain that has been present for about 6 months. This has become more frequent and more severe. This tends to occur after eating. This is associated with significant abdominal bloating but no nausea, vomiting, or diarrhea. The patient was evaluated in the emergency department in December of 2014 was noted to have symptomatic gallstones but no sign of cholecystitis. She tried to manage her symptoms by limiting the amount of fat in her diet. However recently her symptoms have worsened. The patient is scheduled for a followup colonoscopy by Dr. Lucio Edward. She had a tubular adenoma and her cecum and 2013. She is scheduled soon for another colonoscopy.  Past Medical History   Diagnosis  Date   .  Breast cancer  2002   .  Anxiety    .  Gall stones     Past Surgical History   Procedure  Laterality  Date   .  Bilateral mastectomy   2002   .  C section   1997 2001     x2   .  Tubal ligation   2001   .  Abdominal hysterectomy   2002   .  Breast enhancement surgery   2003     bilateral   .  Colonscopy     .  Breast biopsy  Left  2002    Family History   Problem  Relation  Age of Onset   .  Uterine cancer  Mother    .  Ovarian cancer  Mother    .  Breast cancer  Sister    .  Esophageal cancer  Brother    .  Breast cancer  Maternal Grandmother    .  Colon cancer  Neg Hx    Social History  History   Substance Use Topics   .  Smoking status:  Former Smoker     Quit date:  04/17/1979   .  Smokeless tobacco:  Never Used   .  Alcohol Use:  0.6 oz/week     1 Cans of beer per week   No Known Allergies  Current Outpatient Prescriptions   Medication  Sig  Dispense  Refill   .  ALPRAZolam (XANAX) 0.25 MG  tablet      .  sertraline (ZOLOFT) 25 MG tablet      .  Vitamin D, Ergocalciferol, (DRISDOL) 50000 UNITS CAPS capsule      .  MOVIPREP 100 G SOLR  moviprep as directed. No substitutions  1 kit  0   .  oxyCODONE-acetaminophen (PERCOCET/ROXICET) 5-325 MG per tablet  Take 1 tablet by mouth every 4 (four) hours as needed for severe pain.  40 tablet  0    No current facility-administered medications for this visit.   Review of Systems  Review of Systems  Constitutional: Negative for fever, chills and unexpected weight change.  HENT: Negative for congestion, hearing loss, sore throat, trouble swallowing and voice change.  Eyes: Negative for visual disturbance.  Respiratory: Negative for cough and wheezing.  Cardiovascular: Negative for chest pain, palpitations and leg swelling.  Gastrointestinal: Positive for abdominal pain and abdominal distention. Negative for nausea, vomiting, diarrhea, constipation, blood in stool and anal bleeding.  Genitourinary: Negative for hematuria, vaginal bleeding and difficulty urinating.  Musculoskeletal: Negative for arthralgias.  Skin: Negative for rash and wound.  Neurological: Negative for seizures, syncope and headaches.  Hematological: Negative for adenopathy. Does not bruise/bleed easily.  Psychiatric/Behavioral: Negative for confusion.  Blood pressure 115/65, pulse 64, temperature 97.9 F (36.6 C), resp. rate 16, height _0  (1.626 m), weight 152 lb 6.4 oz (69.128 kg).  Physical Exam  Physical Exam  WDWN in NAD  HEENT: EOMI, sclera anicteric  Neck: No masses, no thyromegaly  Lungs: CTA bilaterally; normal respiratory effort  CV: Regular rate and rhythm; no murmurs  Abd: +bowel sounds, soft, mildly tender in epigastrium and RUQ  Ext: Well-perfused; no edema  Skin: Warm, dry; no sign of jaundice  Data Reviewed  CLINICAL DATA: Right upper quadrant pain  EXAM:  ULTRASOUND ABDOMEN COMPLETE  COMPARISON: None.  FINDINGS:  Gallbladder:  Numerous small  stones. 3 mm wall thickness, borderline abnormal, but  no sonographic Murphy sign or gallbladder distention.  Common bile duct:  Diameter: 4 mm.  Liver:  No focal lesion identified. Within normal limits in parenchymal  echogenicity.  IVC:  No abnormality visualized.  Pancreas:  Visualized portion unremarkable.  Spleen:  Size and appearance within normal limits.  Right Kidney:  Length: 10 cm. Echogenicity within normal limits. No mass or  hydronephrosis visualized.  Left Kidney:  Length: 9.5 cm. Echogenicity within normal limits. No mass or  hydronephrosis visualized.  Abdominal aorta:  No aneurysm visualized.  Other findings:  None.  IMPRESSION:  Numerous gallstones. No focal gallbladder tenderness to suggest  acute cholecystitis.  Electronically Signed  By: Jorje Guild M.D.  On: 09/08/2013 02:16  Lab Results   Component  Value  Date    WBC  9.4  09/08/2013    HGB  13.1  09/08/2013    HCT  38.8  09/08/2013    MCV  89.6  09/08/2013    PLT  253  09/08/2013    Lab Results   Component  Value  Date    CREATININE  0.79  09/08/2013    BUN  11  09/08/2013    NA  138  09/08/2013    K  3.8  09/08/2013    CL  99  09/08/2013    CO2  29  09/08/2013    Lab Results   Component  Value  Date    ALT  59*  09/08/2013    AST  136*  09/08/2013    ALKPHOS  81  09/08/2013    BILITOT  0.3  09/08/2013   Assessment  Chronic calculus cholecystitis  Plan  I recommend that the patient proceed with her colonoscopy. After that is complete, recommend laparoscopic cholecystectomy with intraoperative cholangiogram. The surgical procedure has been discussed with the patient. Potential risks, benefits, alternative treatments, and expected outcomes have been explained. All of the patient's questions at this time have been answered. The likelihood of reaching the patient's treatment goal is good. The patient understand the proposed surgical procedure and wishes to proceed.    Imogene Burn. Georgette Dover,  MD, [facility]  General/ Trauma Surgery  07/07/2014 9:23 PM

## 2014-07-14 ENCOUNTER — Encounter (HOSPITAL_COMMUNITY): Payer: Self-pay | Admitting: Pharmacy Technician

## 2014-07-19 ENCOUNTER — Encounter (HOSPITAL_COMMUNITY): Payer: Self-pay | Admitting: Pharmacy Technician

## 2014-07-21 ENCOUNTER — Encounter (HOSPITAL_COMMUNITY)
Admission: RE | Admit: 2014-07-21 | Discharge: 2014-07-21 | Disposition: A | Discharge status: Home / Self Care | Payer: 59 | Source: Ambulatory Visit | Attending: Surgery | Admitting: Surgery

## 2014-07-21 ENCOUNTER — Encounter (HOSPITAL_COMMUNITY): Payer: Self-pay

## 2014-07-21 DIAGNOSIS — Z01812 Encounter for preprocedural laboratory examination: Secondary | ICD-10-CM | POA: Diagnosis present

## 2014-07-21 HISTORY — DX: Dizziness and giddiness: R42

## 2014-07-21 HISTORY — DX: Panic disorder (episodic paroxysmal anxiety): F41.0

## 2014-07-21 LAB — CBC
HCT: 40.2 % (ref 36.0–46.0)
Hemoglobin: 13.6 g/dL (ref 12.0–15.0)
MCH: 30.6 pg (ref 26.0–34.0)
MCHC: 33.8 g/dL (ref 30.0–36.0)
MCV: 90.5 fL (ref 78.0–100.0)
Platelets: 294 10*3/uL (ref 150–400)
RBC: 4.44 MIL/uL (ref 3.87–5.11)
RDW: 13.3 % (ref 11.5–15.5)
WBC: 6.4 10*3/uL (ref 4.0–10.5)

## 2014-07-27 MED ORDER — CHLORHEXIDINE GLUCONATE 4 % EX LIQD
1.0000 "application " | Freq: Once | CUTANEOUS | Status: DC
  Filled 2014-07-27: qty 15

## 2014-07-27 MED ORDER — CEFAZOLIN SODIUM-DEXTROSE 2-3 GM-% IV SOLR
2.0000 g | INTRAVENOUS | Status: AC
  Administered 2014-07-28: 2 g via INTRAVENOUS
  Filled 2014-07-27: qty 50

## 2014-07-28 ENCOUNTER — Encounter (HOSPITAL_COMMUNITY): Payer: Self-pay | Admitting: *Deleted

## 2014-07-28 ENCOUNTER — Ambulatory Visit (HOSPITAL_COMMUNITY): Payer: 59

## 2014-07-28 ENCOUNTER — Ambulatory Visit (HOSPITAL_COMMUNITY): Payer: 59 | Admitting: Anesthesiology

## 2014-07-28 ENCOUNTER — Ambulatory Visit (HOSPITAL_COMMUNITY)
Admission: RE | Admit: 2014-07-28 | Discharge: 2014-07-28 | Disposition: A | Discharge status: Home / Self Care | Payer: 59 | Source: Ambulatory Visit | Attending: Surgery | Admitting: Surgery

## 2014-07-28 ENCOUNTER — Encounter (HOSPITAL_COMMUNITY)
Admission: RE | Disposition: A | Discharge status: Home / Self Care | Payer: Self-pay | Source: Ambulatory Visit | Attending: Surgery

## 2014-07-28 DIAGNOSIS — Z853 Personal history of malignant neoplasm of breast: Secondary | ICD-10-CM | POA: Diagnosis not present

## 2014-07-28 DIAGNOSIS — Z87891 Personal history of nicotine dependence: Secondary | ICD-10-CM | POA: Diagnosis not present

## 2014-07-28 DIAGNOSIS — K8020 Calculus of gallbladder without cholecystitis without obstruction: Secondary | ICD-10-CM

## 2014-07-28 DIAGNOSIS — K8010 Calculus of gallbladder with chronic cholecystitis without obstruction: Secondary | ICD-10-CM | POA: Insufficient documentation

## 2014-07-28 DIAGNOSIS — K660 Peritoneal adhesions (postprocedural) (postinfection): Secondary | ICD-10-CM | POA: Diagnosis not present

## 2014-07-28 DIAGNOSIS — Z86010 Personal history of colonic polyps: Secondary | ICD-10-CM | POA: Insufficient documentation

## 2014-07-28 DIAGNOSIS — K8018 Calculus of gallbladder with other cholecystitis without obstruction: Secondary | ICD-10-CM | POA: Diagnosis present

## 2014-07-28 DIAGNOSIS — F419 Anxiety disorder, unspecified: Secondary | ICD-10-CM | POA: Diagnosis not present

## 2014-07-28 HISTORY — PX: CHOLECYSTECTOMY: SHX55

## 2014-07-28 SURGERY — LAPAROSCOPIC CHOLECYSTECTOMY WITH INTRAOPERATIVE CHOLANGIOGRAM
Anesthesia: General | Site: Abdomen

## 2014-07-28 MED ORDER — MIDAZOLAM HCL 2 MG/2ML IJ SOLN
INTRAMUSCULAR | Status: AC
  Filled 2014-07-28: qty 2

## 2014-07-28 MED ORDER — FENTANYL CITRATE 0.05 MG/ML IJ SOLN
INTRAMUSCULAR | Status: DC | PRN
  Administered 2014-07-28: 100 ug via INTRAVENOUS
  Administered 2014-07-28: 50 ug via INTRAVENOUS

## 2014-07-28 MED ORDER — BUPIVACAINE-EPINEPHRINE (PF) 0.25% -1:200000 IJ SOLN
INTRAMUSCULAR | Status: AC
  Filled 2014-07-28: qty 30

## 2014-07-28 MED ORDER — MEPERIDINE HCL 25 MG/ML IJ SOLN
INTRAMUSCULAR | Status: AC
  Filled 2014-07-28: qty 1

## 2014-07-28 MED ORDER — MORPHINE SULFATE 2 MG/ML IJ SOLN: 2.0000 mg | INTRAMUSCULAR | Status: DC | PRN

## 2014-07-28 MED ORDER — GLYCOPYRROLATE 0.2 MG/ML IJ SOLN
INTRAMUSCULAR | Status: DC | PRN
  Administered 2014-07-28: 0.2 mg via INTRAVENOUS
  Administered 2014-07-28: 0.4 mg via INTRAVENOUS

## 2014-07-28 MED ORDER — FENTANYL CITRATE 0.05 MG/ML IJ SOLN
25.0000 ug | INTRAMUSCULAR | Status: DC | PRN
  Administered 2014-07-28 (×2): 50 ug via INTRAVENOUS

## 2014-07-28 MED ORDER — PHENYLEPHRINE HCL 10 MG/ML IJ SOLN
INTRAMUSCULAR | Status: DC | PRN
  Administered 2014-07-28: 80 ug via INTRAVENOUS

## 2014-07-28 MED ORDER — ONDANSETRON HCL 4 MG/2ML IJ SOLN
INTRAMUSCULAR | Status: DC | PRN
  Administered 2014-07-28: 4 mg via INTRAVENOUS

## 2014-07-28 MED ORDER — ROCURONIUM BROMIDE 50 MG/5ML IV SOLN
INTRAVENOUS | Status: AC
  Filled 2014-07-28: qty 1

## 2014-07-28 MED ORDER — SODIUM CHLORIDE 0.9 % IR SOLN
Status: DC | PRN
  Administered 2014-07-28: 1000 mL

## 2014-07-28 MED ORDER — ONDANSETRON HCL 4 MG/2ML IJ SOLN
INTRAMUSCULAR | Status: AC
  Administered 2014-07-28: 4 mg via INTRAVENOUS
  Filled 2014-07-28: qty 2

## 2014-07-28 MED ORDER — LACTATED RINGERS IV SOLN
INTRAVENOUS | Status: DC | PRN
  Administered 2014-07-28 (×2): via INTRAVENOUS

## 2014-07-28 MED ORDER — EPHEDRINE SULFATE 50 MG/ML IJ SOLN
INTRAMUSCULAR | Status: AC
  Filled 2014-07-28: qty 1

## 2014-07-28 MED ORDER — FENTANYL CITRATE 0.05 MG/ML IJ SOLN
INTRAMUSCULAR | Status: AC
  Filled 2014-07-28: qty 5

## 2014-07-28 MED ORDER — OXYCODONE-ACETAMINOPHEN 5-325 MG PO TABS
1.0000 | ORAL_TABLET | ORAL | Status: DC | PRN
  Administered 2014-07-28: 1 via ORAL
  Filled 2014-07-28: qty 1

## 2014-07-28 MED ORDER — OXYCODONE-ACETAMINOPHEN 5-325 MG PO TABS
ORAL_TABLET | ORAL | Status: AC
  Administered 2014-07-28: 1 via ORAL
  Filled 2014-07-28: qty 1

## 2014-07-28 MED ORDER — FENTANYL CITRATE 0.05 MG/ML IJ SOLN
INTRAMUSCULAR | Status: AC
  Filled 2014-07-28: qty 2

## 2014-07-28 MED ORDER — GLYCOPYRROLATE 0.2 MG/ML IJ SOLN
INTRAMUSCULAR | Status: AC
  Filled 2014-07-28: qty 2

## 2014-07-28 MED ORDER — PROMETHAZINE HCL 25 MG/ML IJ SOLN: 6.2500 mg | INTRAMUSCULAR | Status: DC | PRN

## 2014-07-28 MED ORDER — PHENYLEPHRINE 40 MCG/ML (10ML) SYRINGE FOR IV PUSH (FOR BLOOD PRESSURE SUPPORT)
PREFILLED_SYRINGE | INTRAVENOUS | Status: AC
  Filled 2014-07-28: qty 10

## 2014-07-28 MED ORDER — ONDANSETRON HCL 4 MG/2ML IJ SOLN
INTRAMUSCULAR | Status: AC
  Filled 2014-07-28: qty 2

## 2014-07-28 MED ORDER — DEXAMETHASONE SODIUM PHOSPHATE 4 MG/ML IJ SOLN
INTRAMUSCULAR | Status: AC
  Filled 2014-07-28: qty 1

## 2014-07-28 MED ORDER — SODIUM CHLORIDE 0.9 % IV SOLN
INTRAVENOUS | Status: DC | PRN
  Administered 2014-07-28: 50 mL

## 2014-07-28 MED ORDER — ONDANSETRON HCL 4 MG/2ML IJ SOLN
4.0000 mg | INTRAMUSCULAR | Status: DC | PRN
  Administered 2014-07-28: 4 mg via INTRAVENOUS
  Filled 2014-07-28 (×2): qty 2

## 2014-07-28 MED ORDER — LIDOCAINE HCL (CARDIAC) 20 MG/ML IV SOLN
INTRAVENOUS | Status: DC | PRN
  Administered 2014-07-28: 100 mg via INTRAVENOUS

## 2014-07-28 MED ORDER — OXYCODONE-ACETAMINOPHEN 5-325 MG PO TABS
1.0000 | ORAL_TABLET | ORAL | Status: DC | PRN
Start: 2014-07-28 — End: 2020-10-03

## 2014-07-28 MED ORDER — DEXAMETHASONE SODIUM PHOSPHATE 4 MG/ML IJ SOLN
INTRAMUSCULAR | Status: DC | PRN
  Administered 2014-07-28: 4 mg via INTRAVENOUS

## 2014-07-28 MED ORDER — PROPOFOL 10 MG/ML IV BOLUS
INTRAVENOUS | Status: DC | PRN
  Administered 2014-07-28: 150 mg via INTRAVENOUS

## 2014-07-28 MED ORDER — BUPIVACAINE-EPINEPHRINE 0.25% -1:200000 IJ SOLN
INTRAMUSCULAR | Status: DC | PRN
  Administered 2014-07-28: 30 mL

## 2014-07-28 MED ORDER — SODIUM CHLORIDE 0.9 % IJ SOLN
INTRAMUSCULAR | Status: AC
  Filled 2014-07-28: qty 10

## 2014-07-28 MED ORDER — MIDAZOLAM HCL 5 MG/5ML IJ SOLN
INTRAMUSCULAR | Status: DC | PRN
  Administered 2014-07-28: 2 mg via INTRAVENOUS

## 2014-07-28 MED ORDER — LIDOCAINE HCL (CARDIAC) 20 MG/ML IV SOLN
INTRAVENOUS | Status: AC
  Filled 2014-07-28: qty 5

## 2014-07-28 MED ORDER — 0.9 % SODIUM CHLORIDE (POUR BTL) OPTIME
TOPICAL | Status: DC | PRN
  Administered 2014-07-28: 1000 mL

## 2014-07-28 MED ORDER — NEOSTIGMINE METHYLSULFATE 10 MG/10ML IV SOLN
INTRAVENOUS | Status: DC | PRN
Start: 2014-07-28 — End: 2014-07-28
  Administered 2014-07-28: 3 mg via INTRAVENOUS

## 2014-07-28 MED ORDER — MEPERIDINE HCL 25 MG/ML IJ SOLN
6.2500 mg | INTRAMUSCULAR | Status: DC | PRN
  Administered 2014-07-28: 6.25 mg via INTRAVENOUS

## 2014-07-28 MED ORDER — PROPOFOL 10 MG/ML IV BOLUS
INTRAVENOUS | Status: AC
  Filled 2014-07-28: qty 20

## 2014-07-28 MED ORDER — SUCCINYLCHOLINE CHLORIDE 20 MG/ML IJ SOLN
INTRAMUSCULAR | Status: AC
  Filled 2014-07-28: qty 1

## 2014-07-28 MED ORDER — NEOSTIGMINE METHYLSULFATE 10 MG/10ML IV SOLN
INTRAVENOUS | Status: AC
  Filled 2014-07-28: qty 1

## 2014-07-28 MED ORDER — ROCURONIUM BROMIDE 100 MG/10ML IV SOLN
INTRAVENOUS | Status: DC | PRN
  Administered 2014-07-28: 40 mg via INTRAVENOUS

## 2014-07-28 SURGICAL SUPPLY — 48 items
APL SKNCLS STERI-STRIP NONHPOA (GAUZE/BANDAGES/DRESSINGS) ×1
APPLIER CLIP ROT 10 11.4 M/L (STAPLE) ×2
APR CLP MED LRG 11.4X10 (STAPLE) ×1
BAG SPEC RTRVL LRG 6X4 10 (ENDOMECHANICALS) ×1
BENZOIN TINCTURE PRP APPL 2/3 (GAUZE/BANDAGES/DRESSINGS) ×2 IMPLANT
CANISTER SUCTION 2500CC (MISCELLANEOUS) ×2 IMPLANT
CHLORAPREP W/TINT 26ML (MISCELLANEOUS) ×2 IMPLANT
CLIP APPLIE ROT 10 11.4 M/L (STAPLE) ×1 IMPLANT
COVER MAYO STAND STRL (DRAPES) ×2 IMPLANT
COVER SURGICAL LIGHT HANDLE (MISCELLANEOUS) ×2 IMPLANT
DRAPE C-ARM 42X72 X-RAY (DRAPES) ×2 IMPLANT
DRAPE LAPAROSCOPIC ABDOMINAL (DRAPES) ×2 IMPLANT
DRAPE UTILITY 15X26 W/TAPE STR (DRAPE) ×4 IMPLANT
DRSG TEGADERM 2-3/8X2-3/4 SM (GAUZE/BANDAGES/DRESSINGS) ×8 IMPLANT
DRSG TEGADERM 4X4.75 (GAUZE/BANDAGES/DRESSINGS) ×2 IMPLANT
ELECT REM PT RETURN 9FT ADLT (ELECTROSURGICAL) ×2
ELECTRODE REM PT RTRN 9FT ADLT (ELECTROSURGICAL) ×1 IMPLANT
FILTER SMOKE EVAC LAPAROSHD (FILTER) ×2 IMPLANT
GAUZE SPONGE 2X2 8PLY STRL LF (GAUZE/BANDAGES/DRESSINGS) ×1 IMPLANT
GLOVE BIO SURGEON STRL SZ 6.5 (GLOVE) ×2 IMPLANT
GLOVE BIO SURGEON STRL SZ7 (GLOVE) ×4 IMPLANT
GLOVE BIOGEL PI IND STRL 7.0 (GLOVE) ×3 IMPLANT
GLOVE BIOGEL PI IND STRL 7.5 (GLOVE) ×1 IMPLANT
GLOVE BIOGEL PI INDICATOR 7.0 (GLOVE) ×3
GLOVE BIOGEL PI INDICATOR 7.5 (GLOVE) ×1
GLOVE SURG SS PI 6.5 STRL IVOR (GLOVE) ×2 IMPLANT
GOWN STRL REUS W/ TWL LRG LVL3 (GOWN DISPOSABLE) ×4 IMPLANT
GOWN STRL REUS W/TWL LRG LVL3 (GOWN DISPOSABLE) ×8
KIT BASIN OR (CUSTOM PROCEDURE TRAY) ×2 IMPLANT
KIT ROOM TURNOVER OR (KITS) ×2 IMPLANT
NS IRRIG 1000ML POUR BTL (IV SOLUTION) ×2 IMPLANT
PAD ARMBOARD 7.5X6 YLW CONV (MISCELLANEOUS) ×2 IMPLANT
POUCH SPECIMEN RETRIEVAL 10MM (ENDOMECHANICALS) ×2 IMPLANT
SCISSORS LAP 5X35 DISP (ENDOMECHANICALS) ×2 IMPLANT
SET CHOLANGIOGRAPH 5 50 .035 (SET/KITS/TRAYS/PACK) ×2 IMPLANT
SET IRRIG TUBING LAPAROSCOPIC (IRRIGATION / IRRIGATOR) ×2 IMPLANT
SLEEVE ENDOPATH XCEL 5M (ENDOMECHANICALS) ×2 IMPLANT
SPECIMEN JAR SMALL (MISCELLANEOUS) ×2 IMPLANT
SPONGE GAUZE 2X2 STER 10/PKG (GAUZE/BANDAGES/DRESSINGS) ×1
STRIP CLOSURE SKIN 1/2X4 (GAUZE/BANDAGES/DRESSINGS) ×2 IMPLANT
SUT MNCRL AB 4-0 PS2 18 (SUTURE) ×2 IMPLANT
TOWEL OR 17X24 6PK STRL BLUE (TOWEL DISPOSABLE) ×2 IMPLANT
TOWEL OR 17X26 10 PK STRL BLUE (TOWEL DISPOSABLE) ×2 IMPLANT
TRAY LAPAROSCOPIC (CUSTOM PROCEDURE TRAY) ×2 IMPLANT
TROCAR XCEL BLUNT TIP 100MML (ENDOMECHANICALS) ×2 IMPLANT
TROCAR XCEL NON-BLD 11X100MML (ENDOMECHANICALS) ×2 IMPLANT
TROCAR XCEL NON-BLD 5MMX100MML (ENDOMECHANICALS) ×2 IMPLANT
TUBING INSUFFLATION (TUBING) ×2 IMPLANT

## 2014-07-28 NOTE — Progress Notes (Signed)
Report given to maria rn as caregiver 

## 2014-07-28 NOTE — Anesthesia Procedure Notes (Signed)
Procedure Name: Intubation Date/Time: 07/28/2014 8:32 AM Performed by: Julian Reil Pre-anesthesia Checklist: Patient identified, Emergency Drugs available, Suction available and Patient being monitored Patient Re-evaluated:Patient Re-evaluated prior to inductionOxygen Delivery Method: Circle system utilized Preoxygenation: Pre-oxygenation with 100% oxygen Intubation Type: IV induction Ventilation: Mask ventilation without difficulty Laryngoscope Size: Mac and 3 Grade View: Grade I Tube type: Oral Tube size: 7.5 mm Number of attempts: 1 Airway Equipment and Method: Stylet Placement Confirmation: ETT inserted through vocal cords under direct vision,  positive ETCO2 and breath sounds checked- equal and bilateral Secured at: 21 cm Tube secured with: Tape Dental Injury: Teeth and Oropharynx as per pre-operative assessment

## 2014-07-28 NOTE — Op Note (Signed)
Laparoscopic Cholecystectomy with IOC Procedure Note  Indications: This patient presents with symptomatic gallbladder disease and will undergo laparoscopic cholecystectomy.  Pre-operative Diagnosis: Calculus of gallbladder with other cholecystitis, without mention of obstruction  Post-operative Diagnosis: Same  Surgeon: Cipriano Millikan K.   Assistants: none  Anesthesia: General endotracheal anesthesia  ASA Class: 1  Procedure Details  The patient was seen again in the Holding Room. The risks, benefits, complications, treatment options, and expected outcomes were discussed with the patient. The possibilities of reaction to medication, pulmonary aspiration, perforation of viscus, bleeding, recurrent infection, finding a normal gallbladder, the need for additional procedures, failure to diagnose a condition, the possible need to convert to an open procedure, and creating a complication requiring transfusion or operation were discussed with the patient. The likelihood of improving the patient's symptoms with return to their baseline status is good.  The patient and/or family concurred with the proposed plan, giving informed consent. The site of surgery properly noted. The patient was taken to Operating Room, identified as Connie Ochoa and the procedure verified as Laparoscopic Cholecystectomy with Intraoperative Cholangiogram. A Time Out was held and the above information confirmed.  Prior to the induction of general anesthesia, antibiotic prophylaxis was administered. General endotracheal anesthesia was then administered and tolerated well. After the induction, the abdomen was prepped with Chloraprep and draped in the sterile fashion. The patient was positioned in the supine position.  Local anesthetic agent was injected into the skin near the umbilicus and an incision made. We dissected down to the abdominal fascia with blunt dissection.  The fascia was incised vertically and we entered the  peritoneal cavity bluntly.  A pursestring suture of 0-Vicryl was placed around the fascial opening.  The Hasson cannula was inserted and secured with the stay suture.  Pneumoperitoneum was then created with CO2 and tolerated well without any adverse changes in the patient's vital signs. An 11-mm port was placed in the subxiphoid position.  Two 5-mm ports were placed in the right upper quadrant. All skin incisions were infiltrated with a local anesthetic agent before making the incision and placing the trocars.   We positioned the patient in reverse Trendelenburg, tilted slightly to the patient's left.  The gallbladder was identified, the fundus grasped and retracted cephalad. There were significant omental adhesions to the fundus of the gallbladder.  Adhesions were lysed bluntly and with the electrocautery where indicated, taking care not to injure any adjacent organs or viscus. The infundibulum was grasped and retracted laterally, exposing the peritoneum overlying the triangle of Calot. This was then divided and exposed in a blunt fashion. A critical view of the cystic duct and cystic artery was obtained.  The cystic duct was clearly identified and bluntly dissected circumferentially. The cystic duct was ligated with a clip distally.   An incision was made in the cystic duct and the [facility] cholangiogram catheter introduced. The catheter was secured using a clip. A cholangiogram was then obtained which showed good visualization of the distal and proximal biliary tree with no sign of filling defects or obstruction.  Contrast flowed easily into the duodenum. The catheter was then removed.   The cystic duct was then ligated with clips and divided. The cystic artery was identified, dissected free, ligated with clips and divided as well.   The gallbladder was dissected from the liver bed in retrograde fashion with the electrocautery. A small amount of bile was spilled, but no stones were noted.  The gallbladder was  removed and placed in an  Endocatch sac. The liver bed was irrigated and inspected. Hemostasis was achieved with the electrocautery. Copious irrigation was utilized and was repeatedly aspirated until clear.  The gallbladder and Endocatch sac were then removed through the umbilical port site.  The pursestring suture was used to close the umbilical fascia.    We again inspected the right upper quadrant for hemostasis.  Pneumoperitoneum was released as we removed the trocars.  4-0 Monocryl was used to close the skin.   Benzoin, steri-strips, and clean dressings were applied. The patient was then extubated and brought to the recovery room in stable condition. Instrument, sponge, and needle counts were correct at closure and at the conclusion of the case.   Findings: Cholecystitis with Cholelithiasis  Estimated Blood Loss: Minimal         Drains: none         Specimens: Gallbladder           Complications: None; patient tolerated the procedure well.         Disposition: PACU - hemodynamically stable.         Condition: stable   Imogene Burn. Georgette Dover, MD, [facility]  General/ Trauma Surgery  07/28/2014 9:33 AM

## 2014-07-28 NOTE — Anesthesia Preprocedure Evaluation (Addendum)
Anesthesia Evaluation  Patient identified by MRN, date of birth, ID band Patient awake    Reviewed: Allergy & Precautions, H&P , NPO status , Patient's Chart, lab work & pertinent test results  Airway Mallampati: II       Dental  (+) Teeth Intact   Pulmonary former smoker (quit 1980),  breath sounds clear to auscultation        Cardiovascular negative cardio ROS  Rhythm:Regular Rate:Normal     Neuro/Psych Anxiety    GI/Hepatic negative GI ROS, Neg liver ROS,   Endo/Other  negative endocrine ROS  Renal/GU negative Renal ROS     Musculoskeletal   Abdominal (+)  Abdomen: soft.    Peds  Hematology negative hematology ROS (+)   Anesthesia Other Findings   Reproductive/Obstetrics                           Anesthesia Physical Anesthesia Plan  ASA: II  Anesthesia Plan: General   Post-op Pain Management:    Induction: Intravenous  Airway Management Planned: Oral ETT  Additional Equipment:   Intra-op Plan:   Post-operative Plan: Extubation in OR  Informed Consent: I have reviewed the patients History and Physical, chart, labs and discussed the procedure including the risks, benefits and alternatives for the proposed anesthesia with the patient or authorized representative who has indicated his/her understanding and acceptance.     Plan Discussed with:   Anesthesia Plan Comments:         Anesthesia Quick Evaluation

## 2014-07-28 NOTE — Interval H&P Note (Signed)
History and Physical Interval Note:  07/28/2014 7:45 AM  Connie Ochoa  has presented today for surgery, with the diagnosis of Caluculus Cholecystits  The various methods of treatment have been discussed with the patient and family. After consideration of risks, benefits and other options for treatment, the patient has consented to  Procedure(s): LAPAROSCOPIC CHOLECYSTECTOMY WITH INTRAOPERATIVE CHOLANGIOGRAM (N/A) as a surgical intervention .  The patient's history has been reviewed, patient examined, no change in status, stable for surgery.  I have reviewed the patient's chart and labs.  Questions were answered to the patient's satisfaction.     Filip Luten K.

## 2014-07-28 NOTE — Transfer of Care (Signed)
Immediate Anesthesia Transfer of Care Note  Patient: Connie Ochoa  Procedure(s) Performed: Procedure(s): LAPAROSCOPIC CHOLECYSTECTOMY WITH INTRAOPERATIVE CHOLANGIOGRAM (N/A)  Patient Location: PACU  Anesthesia Type:General  Level of Consciousness: awake, alert , oriented and patient cooperative  Airway & Oxygen Therapy: Patient Spontanous Breathing and Patient connected to nasal cannula oxygen  Post-op Assessment: Report given to PACU RN, Post -op Vital signs reviewed and stable and Patient moving all extremities  Post vital signs: Reviewed and stable  Complications: No apparent anesthesia complications

## 2014-07-28 NOTE — Anesthesia Postprocedure Evaluation (Signed)
  Anesthesia Post-op Note  Patient: Connie Ochoa  Procedure(s) Performed: Procedure(s): LAPAROSCOPIC CHOLECYSTECTOMY WITH INTRAOPERATIVE CHOLANGIOGRAM (N/A)  Patient Location: PACU  Anesthesia Type:General  Level of Consciousness: awake and alert   Airway and Oxygen Therapy: Patient Spontanous Breathing and Patient connected to nasal cannula oxygen  Post-op Pain: mild  Post-op Assessment: Post-op Vital signs reviewed, Patient's Cardiovascular Status Stable, Patent Airway and No signs of Nausea or vomiting  Post-op Vital Signs: Reviewed and stable  Last Vitals:  Filed Vitals:   07/28/14 1006  BP: 124/76  Pulse: 66  Temp:   Resp: 12    Complications: No apparent anesthesia complications

## 2014-07-28 NOTE — Discharge Instructions (Signed)
[facility] °LAPAROSCOPIC SURGERY: POST OP INSTRUCTIONS °Always review your discharge instruction sheet given to you by the facility where your surgery was performed. °IF YOU HAVE DISABILITY OR FAMILY LEAVE FORMS, YOU MUST BRING THEM TO THE OFFICE FOR PROCESSING.   °DO NOT GIVE THEM TO YOUR DOCTOR. ° °1. A prescription for pain medication will be given to you upon discharge.  Take your pain medication as prescribed, if needed.  If narcotic pain medicine is not needed, then you may take acetaminophen (Tylenol) or ibuprofen (Advil) as needed. °2. Take your usually prescribed medications unless otherwise directed. °3. If you need a refill on your pain medication, please contact your pharmacy.  They will contact our office to request authorization. Prescriptions will not be filled after 5pm or on week-ends. °4. You should follow a light diet the first few days after arrival home, such as soup and crackers, etc.  Be sure to include lots of fluids daily. °5. Most patients will experience some swelling and bruising in the area of the incisions.  Ice packs will help.  Swelling and bruising can take several days to resolve.  °6. It is common to experience some constipation if taking pain medication after surgery.  Increasing fluid intake and taking a stool softener (such as Colace) will usually help or prevent this problem from occurring.  A mild laxative (Milk of Magnesia or Miralax) should be taken according to package instructions if there are no bowel movements after 48 hours. °7. Unless discharge instructions indicate otherwise, you may remove your bandages 48 hours after surgery, and you may shower at that time.  You will have steri-strips (small skin tapes) in place directly over the incision.  These strips should be left on the skin for 7-10 days.  If your surgeon used skin glue on the incision, you may shower in 24 hours.  The glue will flake off over the next 2-3 weeks.  Any sutures or staples  will be removed at the office during your follow-up visit. °8. ACTIVITIES:  You may resume regular (light) daily activities beginning the next day--such as daily self-care, walking, climbing stairs--gradually increasing activities as tolerated.  You may have sexual intercourse when it is comfortable.  Refrain from any heavy lifting or straining until approved by your doctor. °a. You may drive when you are no longer taking prescription pain medication, you can comfortably wear a seatbelt, and you can safely maneuver your car and apply brakes. °b. RETURN TO WORK:   2-3 weeks °9. You should see your doctor in the office for a follow-up appointment approximately 2-3 weeks after your surgery.  Make sure that you call for this appointment within a day or two after you arrive home to insure a convenient appointment time. °10. OTHER INSTRUCTIONS: ________________________________________________________________________ °WHEN TO CALL YOUR DOCTOR: °1. Fever over 101.0 °2. Inability to urinate °3. Continued bleeding from incision. °4. Increased pain, redness, or drainage from the incision. °5. Increasing abdominal pain ° °The clinic staff is available to answer your questions during regular business hours.  Please don’t hesitate to call and ask to speak to one of the nurses for clinical concerns.  If you have a medical emergency, go to the nearest emergency room or call 911.  A surgeon from [facility] is always on call at the hospital. °[address], [address], [place], [place]  [address] ? [address] [address], [place], [place]   [address] °[phone] ? [phone] ? FAX [phone] °Web site:   [email or web address] ° °

## 2014-07-28 NOTE — H&P (View-Only) (Signed)
Chief Complaint   Patient presents with   .  Cholelithiasis   HPI  Connie Ochoa is a 53 y.o. female. PCP - Dr. Drema Dallas  Referred for evaluation of abdominal pain/ gallstones  HPI  This is a 53 year old female who presents with intermittent epigastric and right upper quadrant abdominal pain that has been present for about 6 months. This has become more frequent and more severe. This tends to occur after eating. This is associated with significant abdominal bloating but no nausea, vomiting, or diarrhea. The patient was evaluated in the emergency department in December of 2014 was noted to have symptomatic gallstones but no sign of cholecystitis. She tried to manage her symptoms by limiting the amount of fat in her diet. However recently her symptoms have worsened. The patient is scheduled for a followup colonoscopy by Dr. Lucio Edward. She had a tubular adenoma and her cecum and 2013. She is scheduled soon for another colonoscopy.  Past Medical History   Diagnosis  Date   .  Breast cancer  2002   .  Anxiety    .  Gall stones     Past Surgical History   Procedure  Laterality  Date   .  Bilateral mastectomy   2002   .  C section   1997 2001     x2   .  Tubal ligation   2001   .  Abdominal hysterectomy   2002   .  Breast enhancement surgery   2003     bilateral   .  Colonscopy     .  Breast biopsy  Left  2002    Family History   Problem  Relation  Age of Onset   .  Uterine cancer  Mother    .  Ovarian cancer  Mother    .  Breast cancer  Sister    .  Esophageal cancer  Brother    .  Breast cancer  Maternal Grandmother    .  Colon cancer  Neg Hx    Social History  History   Substance Use Topics   .  Smoking status:  Former Smoker     Quit date:  04/17/1979   .  Smokeless tobacco:  Never Used   .  Alcohol Use:  0.6 oz/week     1 Cans of beer per week   No Known Allergies  Current Outpatient Prescriptions   Medication  Sig  Dispense  Refill   .  ALPRAZolam (XANAX) 0.25 MG  tablet      .  sertraline (ZOLOFT) 25 MG tablet      .  Vitamin D, Ergocalciferol, (DRISDOL) 50000 UNITS CAPS capsule      .  MOVIPREP 100 G SOLR  moviprep as directed. No substitutions  1 kit  0   .  oxyCODONE-acetaminophen (PERCOCET/ROXICET) 5-325 MG per tablet  Take 1 tablet by mouth every 4 (four) hours as needed for severe pain.  40 tablet  0    No current facility-administered medications for this visit.   Review of Systems  Review of Systems  Constitutional: Negative for fever, chills and unexpected weight change.  HENT: Negative for congestion, hearing loss, sore throat, trouble swallowing and voice change.  Eyes: Negative for visual disturbance.  Respiratory: Negative for cough and wheezing.  Cardiovascular: Negative for chest pain, palpitations and leg swelling.  Gastrointestinal: Positive for abdominal pain and abdominal distention. Negative for nausea, vomiting, diarrhea, constipation, blood in stool and anal bleeding.  Genitourinary: Negative for hematuria, vaginal bleeding and difficulty urinating.  Musculoskeletal: Negative for arthralgias.  Skin: Negative for rash and wound.  Neurological: Negative for seizures, syncope and headaches.  Hematological: Negative for adenopathy. Does not bruise/bleed easily.  Psychiatric/Behavioral: Negative for confusion.  Blood pressure 115/65, pulse 64, temperature 97.9 F (36.6 C), resp. rate 16, height _0  (1.626 m), weight 152 lb 6.4 oz (69.128 kg).  Physical Exam  Physical Exam  WDWN in NAD  HEENT: EOMI, sclera anicteric  Neck: No masses, no thyromegaly  Lungs: CTA bilaterally; normal respiratory effort  CV: Regular rate and rhythm; no murmurs  Abd: +bowel sounds, soft, mildly tender in epigastrium and RUQ  Ext: Well-perfused; no edema  Skin: Warm, dry; no sign of jaundice  Data Reviewed  CLINICAL DATA: Right upper quadrant pain  EXAM:  ULTRASOUND ABDOMEN COMPLETE  COMPARISON: None.  FINDINGS:  Gallbladder:  Numerous small  stones. 3 mm wall thickness, borderline abnormal, but  no sonographic Murphy sign or gallbladder distention.  Common bile duct:  Diameter: 4 mm.  Liver:  No focal lesion identified. Within normal limits in parenchymal  echogenicity.  IVC:  No abnormality visualized.  Pancreas:  Visualized portion unremarkable.  Spleen:  Size and appearance within normal limits.  Right Kidney:  Length: 10 cm. Echogenicity within normal limits. No mass or  hydronephrosis visualized.  Left Kidney:  Length: 9.5 cm. Echogenicity within normal limits. No mass or  hydronephrosis visualized.  Abdominal aorta:  No aneurysm visualized.  Other findings:  None.  IMPRESSION:  Numerous gallstones. No focal gallbladder tenderness to suggest  acute cholecystitis.  Electronically Signed  By: Jorje Guild M.D.  On: 09/08/2013 02:16  Lab Results   Component  Value  Date    WBC  9.4  09/08/2013    HGB  13.1  09/08/2013    HCT  38.8  09/08/2013    MCV  89.6  09/08/2013    PLT  253  09/08/2013    Lab Results   Component  Value  Date    CREATININE  0.79  09/08/2013    BUN  11  09/08/2013    NA  138  09/08/2013    K  3.8  09/08/2013    CL  99  09/08/2013    CO2  29  09/08/2013    Lab Results   Component  Value  Date    ALT  59*  09/08/2013    AST  136*  09/08/2013    ALKPHOS  81  09/08/2013    BILITOT  0.3  09/08/2013   Assessment  Chronic calculus cholecystitis  Plan  I recommend that the patient proceed with her colonoscopy. After that is complete, recommend laparoscopic cholecystectomy with intraoperative cholangiogram. The surgical procedure has been discussed with the patient. Potential risks, benefits, alternative treatments, and expected outcomes have been explained. All of the patient's questions at this time have been answered. The likelihood of reaching the patient's treatment goal is good. The patient understand the proposed surgical procedure and wishes to proceed.    Imogene Burn. Georgette Dover,  MD, [facility]  General/ Trauma Surgery  07/07/2014 9:23 PM

## 2014-07-29 ENCOUNTER — Encounter (HOSPITAL_COMMUNITY): Payer: Self-pay | Admitting: Surgery

## 2015-10-24 IMAGING — US US ABDOMEN COMPLETE
1 series · 14 of 25 positions shown · non-contrast
Comparison: None.

CLINICAL DATA: Right upper quadrant pain

EXAM:
ULTRASOUND ABDOMEN COMPLETE

[Series 1: us abdomen complete · 0.24mm/px · 14 of 104 slices shown]
[im 1/104]
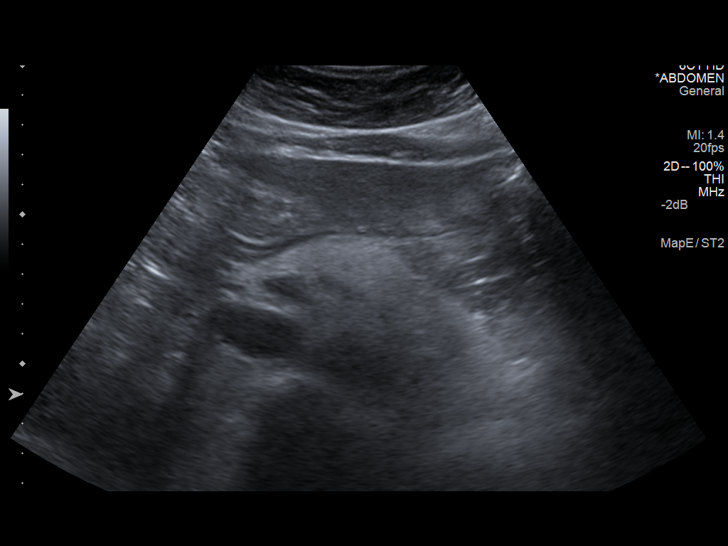
[im 9/104]
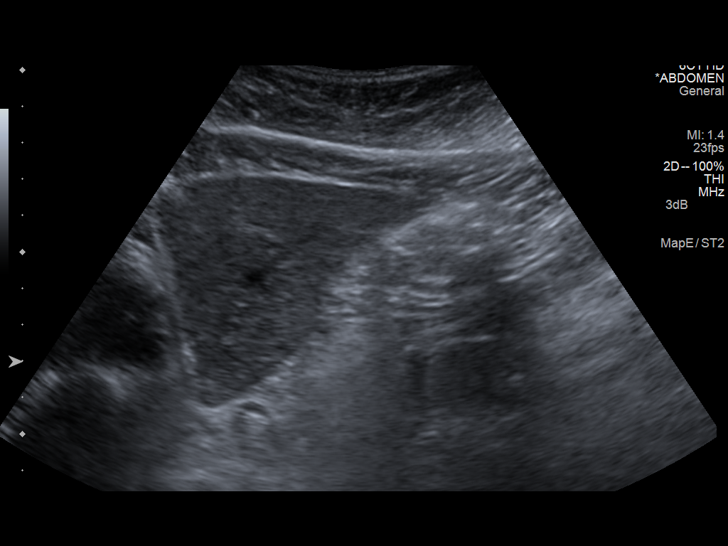
[im 18/104]
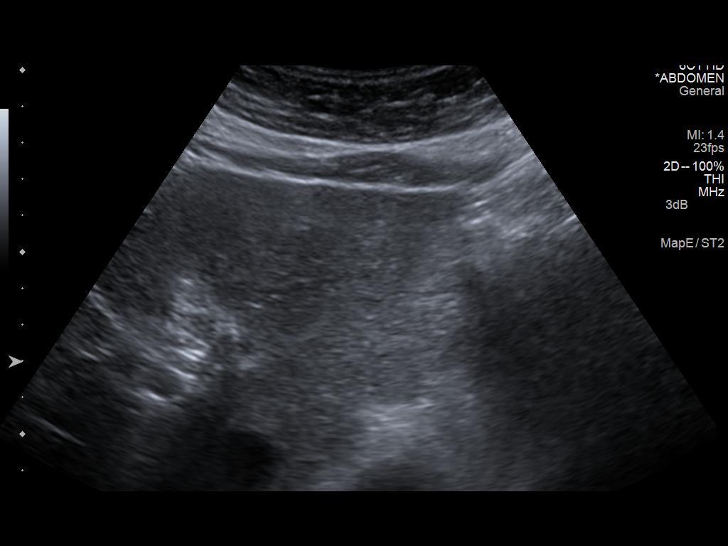
[im 26/104]
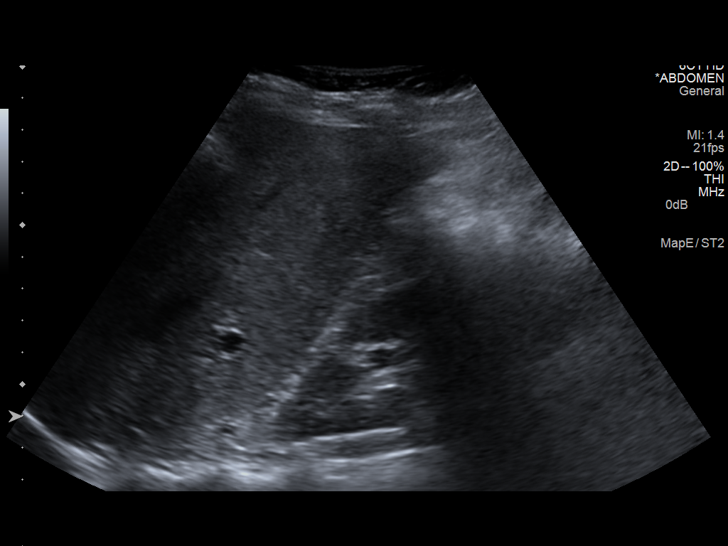
[im 35/104]
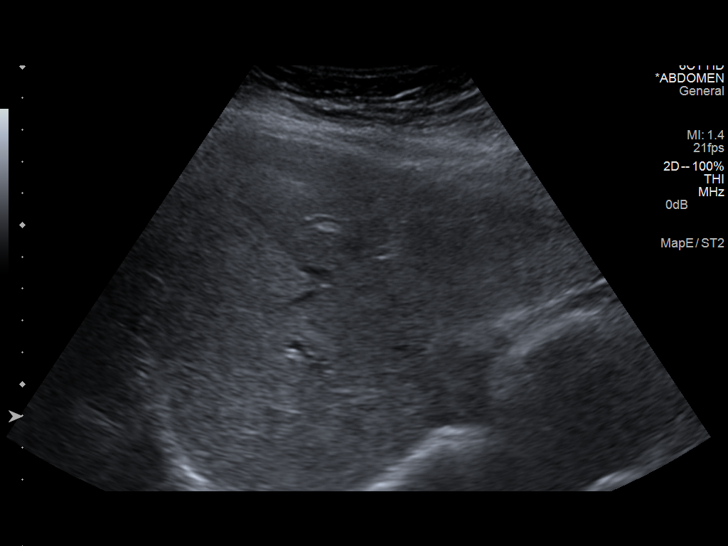
[im 39/104]
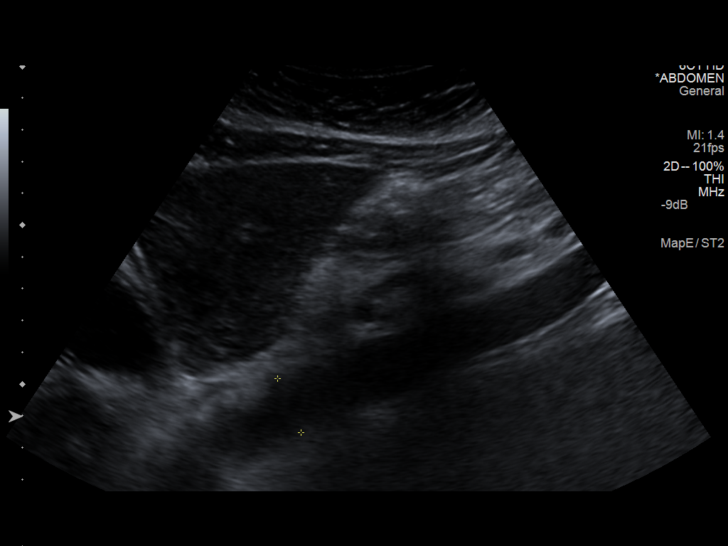
[im 48/104]
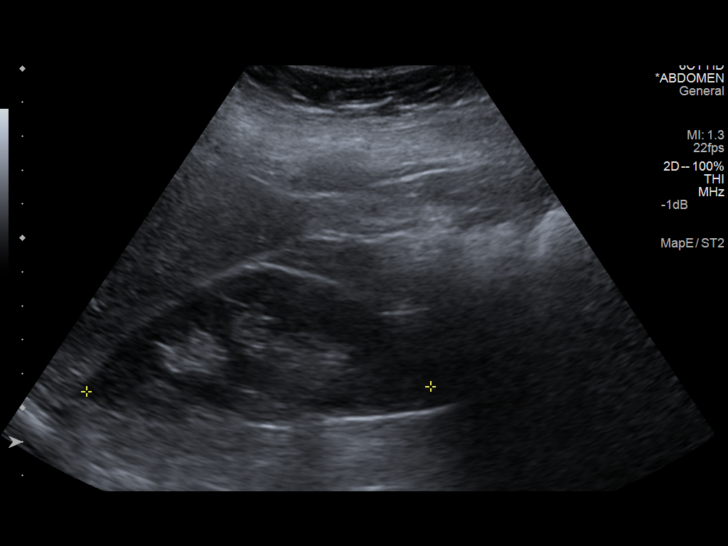
[im 56/104]
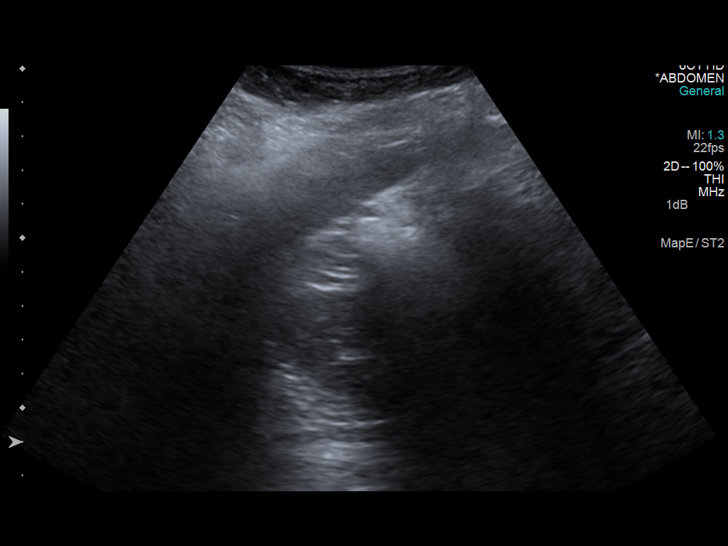
[im 65/104]
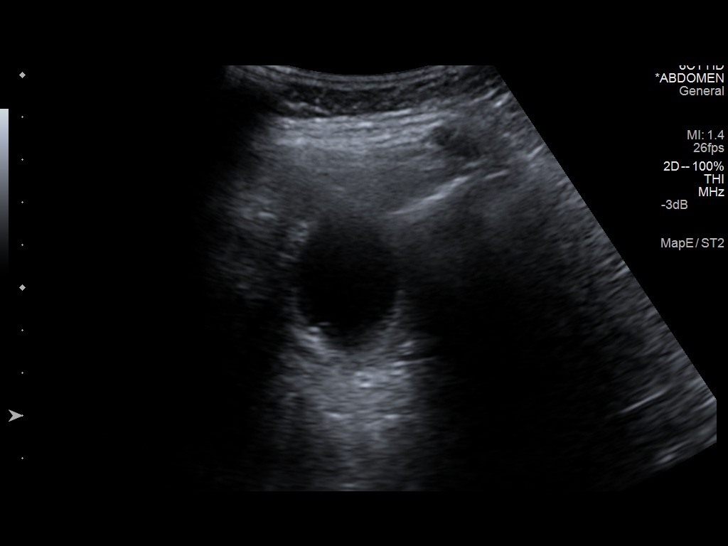
[im 69/104]
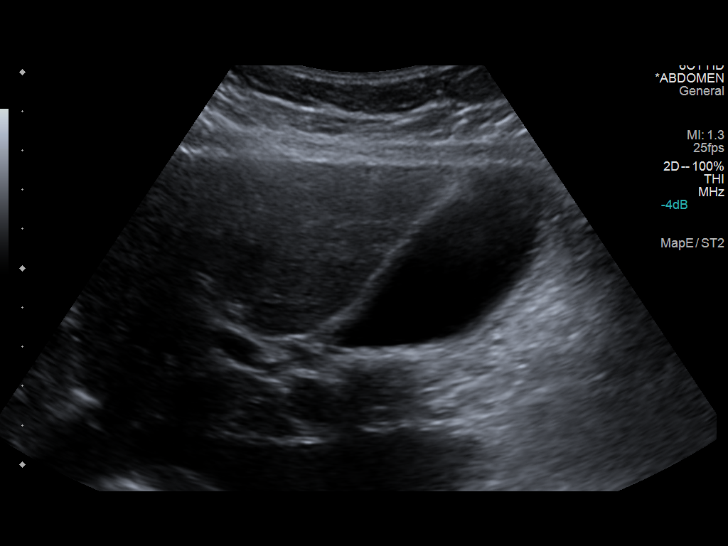
[im 78/104]
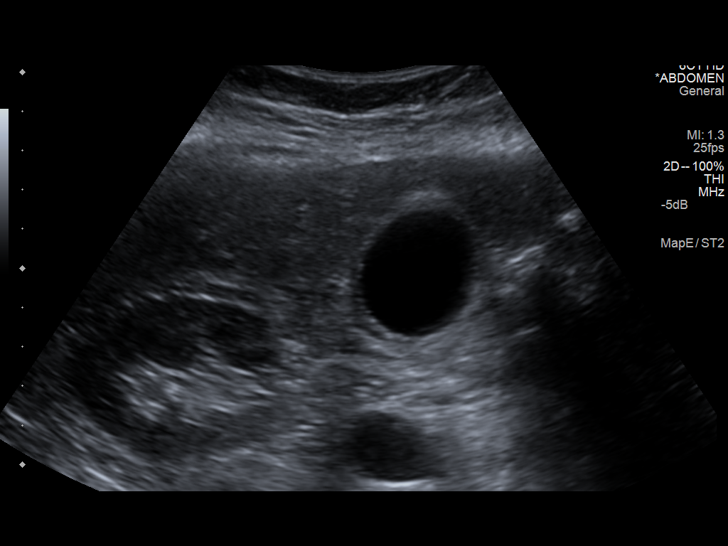
[im 86/104]
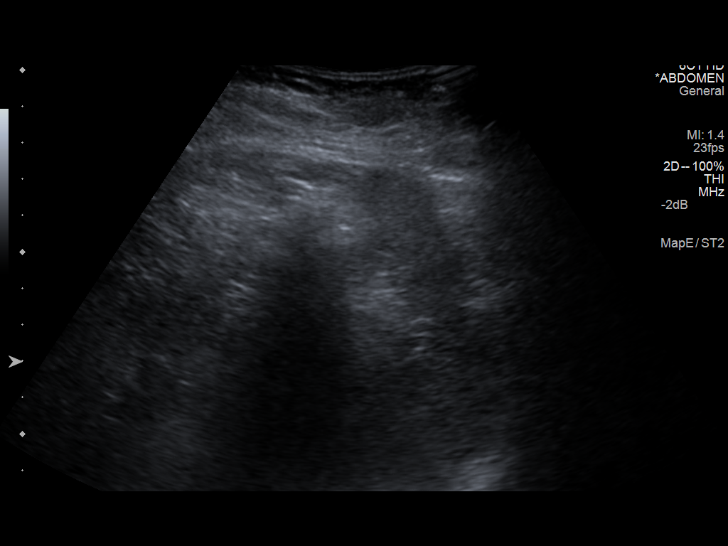
[im 95/104]
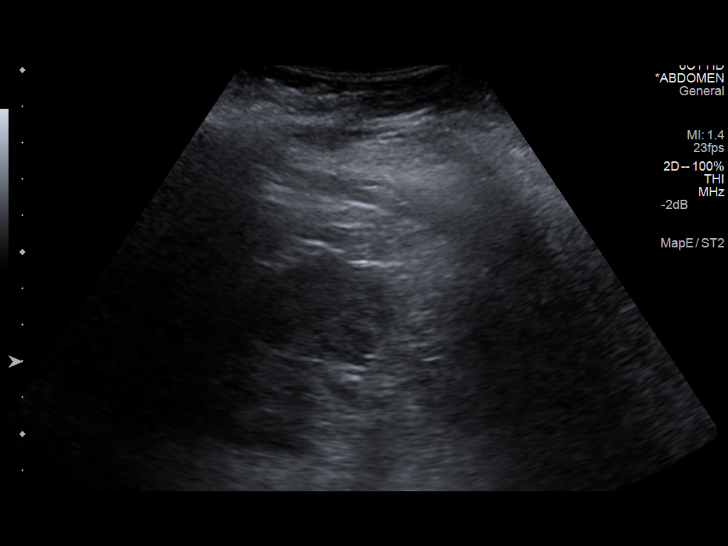
[im 104/104]
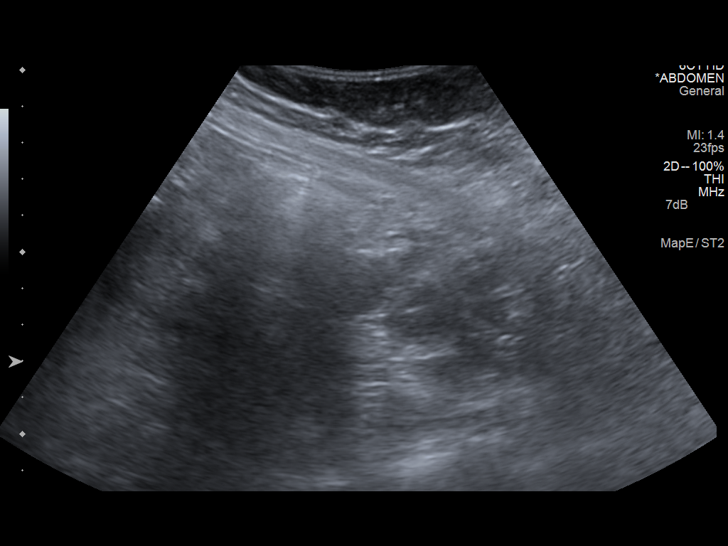

[14 of 25 positions shown; findings below may reference images not displayed]

FINDINGS: Gallbladder:

Numerous small stones. 3 mm wall thickness, borderline abnormal, but
no sonographic Murphy sign or gallbladder distention.

Common bile duct:

Diameter: 4 mm.

Liver:

No focal lesion identified. Within normal limits in parenchymal
echogenicity.

IVC:

No abnormality visualized.

Pancreas:

Visualized portion unremarkable.

Spleen:

Size and appearance within normal limits.

Right Kidney:

Length: 10 cm. Echogenicity within normal limits. No mass or
hydronephrosis visualized.

Left Kidney:

Length: 9.5 cm. Echogenicity within normal limits. No mass or
hydronephrosis visualized.

Abdominal aorta:

No aneurysm visualized.

Other findings:

None.
IMPRESSION: Numerous gallstones. No focal gallbladder tenderness to suggest
acute cholecystitis.

## 2015-11-10 ENCOUNTER — Emergency Department (HOSPITAL_COMMUNITY): Payer: 59

## 2015-11-10 ENCOUNTER — Emergency Department (HOSPITAL_COMMUNITY)
Admission: EM | Admit: 2015-11-10 | Discharge: 2015-11-10 | Disposition: A | Discharge status: Home / Self Care | Payer: 59 | Attending: Emergency Medicine | Admitting: Emergency Medicine

## 2015-11-10 DIAGNOSIS — S060X0A Concussion without loss of consciousness, initial encounter: Secondary | ICD-10-CM

## 2015-11-10 DIAGNOSIS — Y998 Other external cause status: Secondary | ICD-10-CM | POA: Insufficient documentation

## 2015-11-10 DIAGNOSIS — F410 Panic disorder [episodic paroxysmal anxiety] without agoraphobia: Secondary | ICD-10-CM | POA: Insufficient documentation

## 2015-11-10 DIAGNOSIS — Z87891 Personal history of nicotine dependence: Secondary | ICD-10-CM | POA: Diagnosis not present

## 2015-11-10 DIAGNOSIS — Z79899 Other long term (current) drug therapy: Secondary | ICD-10-CM | POA: Diagnosis not present

## 2015-11-10 DIAGNOSIS — Z8719 Personal history of other diseases of the digestive system: Secondary | ICD-10-CM | POA: Diagnosis not present

## 2015-11-10 DIAGNOSIS — Z853 Personal history of malignant neoplasm of breast: Secondary | ICD-10-CM | POA: Diagnosis not present

## 2015-11-10 DIAGNOSIS — W2209XA Striking against other stationary object, initial encounter: Secondary | ICD-10-CM | POA: Diagnosis not present

## 2015-11-10 DIAGNOSIS — Y9389 Activity, other specified: Secondary | ICD-10-CM | POA: Insufficient documentation

## 2015-11-10 DIAGNOSIS — Y9289 Other specified places as the place of occurrence of the external cause: Secondary | ICD-10-CM | POA: Insufficient documentation

## 2015-11-10 DIAGNOSIS — S0990XA Unspecified injury of head, initial encounter: Secondary | ICD-10-CM | POA: Diagnosis present

## 2015-11-10 MED ORDER — BUTALBITAL-APAP-CAFFEINE 50-325-40 MG PO TABS: 1.0000 | ORAL_TABLET | Freq: Four times a day (QID) | ORAL | Status: DC | PRN

## 2015-11-10 MED ORDER — IBUPROFEN 200 MG PO TABS
400.0000 mg | ORAL_TABLET | Freq: Once | ORAL | Status: AC
  Administered 2015-11-10: 400 mg via ORAL
  Filled 2015-11-10: qty 2

## 2015-11-10 MED ORDER — PROCHLORPERAZINE MALEATE 10 MG PO TABS
10.0000 mg | ORAL_TABLET | Freq: Once | ORAL | Status: AC
  Administered 2015-11-10: 10 mg via ORAL
  Filled 2015-11-10 (×2): qty 1

## 2015-11-10 MED ORDER — OXYCODONE-ACETAMINOPHEN 5-325 MG PO TABS
1.0000 | ORAL_TABLET | Freq: Once | ORAL | Status: AC
  Administered 2015-11-10: 1 via ORAL
  Filled 2015-11-10: qty 1

## 2015-11-10 NOTE — ED Notes (Signed)
Pt struck head on desk drawer on Tuesday. Having continual headache.  Nausea with no vomiting.  No loc.

## 2015-11-10 NOTE — Discharge Instructions (Signed)
Do not participate in any sports or any activities that could result in head trauma until you are cleared by your pediatrician,  primary care physician or neurologist.   Please follow with your primary care doctor in the next 2 days for a check-up. They must obtain records for further management.   Do not hesitate to return to the Emergency Department for any new, worsening or concerning symptoms.    Concussion, Adult A concussion, or closed-head injury, is a brain injury caused by a direct blow to the head or by a quick and sudden movement (jolt) of the head or neck. Concussions are usually not life-threatening. Even so, the effects of a concussion can be serious. If you have had a concussion before, you are more likely to experience concussion-like symptoms after a direct blow to the head.  CAUSES  Direct blow to the head, such as from running into another player during a soccer game, being hit in a fight, or hitting your head on a hard surface.  A jolt of the head or neck that causes the brain to move back and forth inside the skull, such as in a car crash. SIGNS AND SYMPTOMS The signs of a concussion can be hard to notice. Early on, they may be missed by you, family members, and health care providers. You may look fine but act or feel differently. Symptoms are usually temporary, but they may last for days, weeks, or even longer. Some symptoms may appear right away while others may not show up for hours or days. Every head injury is different. Symptoms include:  Mild to moderate headaches that will not go away.  A feeling of pressure inside your head.  Having more trouble than usual:  Learning or remembering things you have heard.  Answering questions.  Paying attention or concentrating.  Organizing daily tasks.  Making decisions and solving problems.  Slowness in thinking, acting or reacting, speaking, or reading.  Getting lost or being easily confused.  Feeling tired all  the time or lacking energy (fatigued).  Feeling drowsy.  Sleep disturbances.  Sleeping more than usual.  Sleeping less than usual.  Trouble falling asleep.  Trouble sleeping (insomnia).  Loss of balance or feeling lightheaded or dizzy.  Nausea or vomiting.  Numbness or tingling.  Increased sensitivity to:  Sounds.  Lights.  Distractions.  Vision problems or eyes that tire easily.  Diminished sense of taste or smell.  Ringing in the ears.  Mood changes such as feeling sad or anxious.  Becoming easily irritated or angry for little or no reason.  Lack of motivation.  Seeing or hearing things other people do not see or hear (hallucinations). DIAGNOSIS Your health care provider can usually diagnose a concussion based on a description of your injury and symptoms. He or she will ask whether you passed out (lost consciousness) and whether you are having trouble remembering events that happened right before and during your injury. Your evaluation might include:  A brain scan to look for signs of injury to the brain. Even if the test shows no injury, you may still have a concussion.  Blood tests to be sure other problems are not present. TREATMENT  Concussions are usually treated in an emergency department, in urgent care, or at a clinic. You may need to stay in the hospital overnight for further treatment.  Tell your health care provider if you are taking any medicines, including prescription medicines, over-the-counter medicines, and natural remedies. Some medicines, such as blood  thinners (anticoagulants) and aspirin, may increase the chance of complications. Also tell your health care provider whether you have had alcohol or are taking illegal drugs. This information may affect treatment.  Your health care provider will send you home with important instructions to follow.  How fast you will recover from a concussion depends on many factors. These factors include how  severe your concussion is, what part of your brain was injured, your age, and how healthy you were before the concussion.  Most people with mild injuries recover fully. Recovery can take time. In general, recovery is slower in older persons. Also, persons who have had a concussion in the past or have other medical problems may find that it takes longer to recover from their current injury. HOME CARE INSTRUCTIONS General Instructions  Carefully follow the directions your health care provider gave you.  Only take over-the-counter or prescription medicines for pain, discomfort, or fever as directed by your health care provider.  Take only those medicines that your health care provider has approved.  Do not drink alcohol until your health care provider says you are well enough to do so. Alcohol and certain other drugs may slow your recovery and can put you at risk of further injury.  If it is harder than usual to remember things, write them down.  If you are easily distracted, try to do one thing at a time. For example, do not try to watch TV while fixing dinner.  Talk with family members or close friends when making important decisions.  Keep all follow-up appointments. Repeated evaluation of your symptoms is recommended for your recovery.  Watch your symptoms and tell others to do the same. Complications sometimes occur after a concussion. Older adults with a brain injury may have a higher risk of serious complications, such as a blood clot on the brain.  Tell your teachers, school nurse, school counselor, coach, athletic trainer, or work Freight forwarder about your injury, symptoms, and restrictions. Tell them about what you can or cannot do. They should watch for:  Increased problems with attention or concentration.  Increased difficulty remembering or learning new information.  Increased time needed to complete tasks or assignments.  Increased irritability or decreased ability to cope with  stress.  Increased symptoms.  Rest. Rest helps the brain to heal. Make sure you:  Get plenty of sleep at night. Avoid staying up late at night.  Keep the same bedtime hours on weekends and weekdays.  Rest during the day. Take daytime naps or rest breaks when you feel tired.  Limit activities that require a lot of thought or concentration. These include:  Doing homework or job-related work.  Watching TV.  Working on the computer.  Avoid any situation where there is potential for another head injury (football, hockey, soccer, basketball, martial arts, downhill snow sports and horseback riding). Your condition will get worse every time you experience a concussion. You should avoid these activities until you are evaluated by the appropriate follow-up health care providers. Returning To Your Regular Activities You will need to return to your normal activities slowly, not all at once. You must give your body and brain enough time for recovery.  Do not return to sports or other athletic activities until your health care provider tells you it is safe to do so.  Ask your health care provider when you can drive, ride a bicycle, or operate heavy machinery. Your ability to react may be slower after a brain injury. Never do these  activities if you are dizzy.  Ask your health care provider about when you can return to work or school. Preventing Another Concussion It is very important to avoid another brain injury, especially before you have recovered. In rare cases, another injury can lead to permanent brain damage, brain swelling, or death. The risk of this is greatest during the first 7-10 days after a head injury. Avoid injuries by:  Wearing a seat belt when riding in a car.  Drinking alcohol only in moderation.  Wearing a helmet when biking, skiing, skateboarding, skating, or doing similar activities.  Avoiding activities that could lead to a second concussion, such as contact or  recreational sports, until your health care provider says it is okay.  Taking safety measures in your home.  Remove clutter and tripping hazards from floors and stairways.  Use grab bars in bathrooms and handrails by stairs.  Place non-slip mats on floors and in bathtubs.  Improve lighting in dim areas. SEEK MEDICAL CARE IF:  You have increased problems paying attention or concentrating.  You have increased difficulty remembering or learning new information.  You need more time to complete tasks or assignments than before.  You have increased irritability or decreased ability to cope with stress.  You have more symptoms than before. Seek medical care if you have any of the following symptoms for more than 2 weeks after your injury:  Lasting (chronic) headaches.  Dizziness or balance problems.  Nausea.  Vision problems.  Increased sensitivity to noise or light.  Depression or mood swings.  Anxiety or irritability.  Memory problems.  Difficulty concentrating or paying attention.  Sleep problems.  Feeling tired all the time. SEEK IMMEDIATE MEDICAL CARE IF:  You have severe or worsening headaches. These may be a sign of a blood clot in the brain.  You have weakness (even if only in one hand, leg, or part of the face).  You have numbness.  You have decreased coordination.  You vomit repeatedly.  You have increased sleepiness.  One pupil is larger than the other.  You have convulsions.  You have slurred speech.  You have increased confusion. This may be a sign of a blood clot in the brain.  You have increased restlessness, agitation, or irritability.  You are unable to recognize people or places.  You have neck pain.  It is difficult to wake you up.  You have unusual behavior changes.  You lose consciousness. MAKE SURE YOU:  Understand these instructions.  Will watch your condition.  Will get help right away if you are not doing well or  get worse.   This information is not intended to replace advice given to you by your health care provider. Make sure you discuss any questions you have with your health care provider.   Document Released: 12/01/2003 Document Revised: 10/01/2014 Document Reviewed: 04/02/2013 Elsevier Interactive Patient Education 2016 [employer]  Post-Concussion Syndrome Post-concussion syndrome describes the symptoms that can occur after a head injury. These symptoms can last from weeks to months. CAUSES  It is not clear why some head injuries cause post-concussion syndrome. It can occur whether your head injury was mild or severe and whether you were wearing head protection or not.  SIGNS AND SYMPTOMS  Memory difficulties.  Dizziness.  Headaches.  Double vision or blurry vision.  Sensitivity to light.  Hearing difficulties.  Depression.  Tiredness.  Weakness.  Difficulty with concentration.  Difficulty sleeping or staying asleep.  Vomiting.  Poor balance or instability  on your feet.  Slow reaction time.  Difficulty learning and remembering things you have heard. DIAGNOSIS  There is no test to determine whether you have post-concussion syndrome. Your health care provider may order an imaging scan of your brain, such as a CT scan, to check for other problems that may be causing your symptoms (such as a severe injury inside your skull). TREATMENT  Usually, these problems disappear over time without medical care. Your health care provider may prescribe medicine to help ease your symptoms. It is important to follow up with a neurologist to evaluate your recovery and address any lingering symptoms or issues. HOME CARE INSTRUCTIONS   Take medicines only as directed by your health care provider. Do not take aspirin. Aspirin can slow blood clotting.  Sleep with your head slightly elevated to help with headaches.  Avoid any situation where there is potential for another head injury.  This includes football, hockey, soccer, basketball, martial arts, downhill snow sports, and horseback riding. Your condition will get worse every time you experience a concussion. You should avoid these activities until you are evaluated by the appropriate follow-up health care providers.  Keep all follow-up visits as directed by your health care provider. This is important. SEEK MEDICAL CARE IF:  You have increased problems paying attention or concentrating.  You have increased difficulty remembering or learning new information.  You need more time to complete tasks or assignments than before.  You have increased irritability or decreased ability to cope with stress.  You have more symptoms than before. Seek medical care if you have any of the following symptoms for more than two weeks after your injury:  Lasting (chronic) headaches.  Dizziness or balance problems.  Nausea.  Vision problems.  Increased sensitivity to noise or light.  Depression or mood swings.  Anxiety or irritability.  Memory problems.  Difficulty concentrating or paying attention.  Sleep problems.  Feeling tired all the time. SEEK IMMEDIATE MEDICAL CARE IF:  You have confusion or unusual drowsiness.  Others find it difficult to wake you up.  You have nausea or persistent, forceful vomiting.  You feel like you are moving when you are not (vertigo). Your eyes may move rapidly back and forth.  You have convulsions or faint.  You have severe, persistent headaches that are not relieved by medicine.  You cannot use your arms or legs normally.  One of your pupils is larger than the other.  You have clear or bloody discharge from your nose or ears.  Your problems are getting worse, not better. MAKE SURE YOU:  Understand these instructions.  Will watch your condition.  Will get help right away if you are not doing well or get worse.   This information is not intended to replace advice given  to you by your health care provider. Make sure you discuss any questions you have with your health care provider.   Document Released: 03/02/2002 Document Revised: 10/01/2014 Document Reviewed: 12/16/2013 Elsevier Interactive Patient Education [employer].

## 2015-11-10 NOTE — ED Notes (Signed)
ED PA at bedside

## 2015-11-10 NOTE — ED Provider Notes (Signed)
CSN: [id number]     Arrival date & time 11/10/15  1348 History   First MD Initiated Contact with Patient 11/10/15 1739     Chief Complaint  Patient presents with  . Head Injury     (Consider location/radiation/quality/duration/timing/severity/associated sxs/prior Treatment) HPI   Blood pressure 116/73, pulse 63, temperature 97.9 F (36.6 C), temperature source Oral, resp. rate 18, SpO2 98 %.  Connie Ochoa is a 55 y.o. female complaining of persistent frontal headache after patient hit her head on a desk drawer 2 days ago. She was leaning down to turn something often forgot that the drawer was open, the light was off she had it squarely in the center of her forehead. She is not anticoagulated, there was no loss of consciousness, she has nausea with no vomiting. No change in her vision, she describes an ataxia and that she has to grab onto things to steady herself or she walks. She describes her pain at 7 out of 10, is frontal, she took [place] powder with some relief, she was given a waiting with little relief.  Past Medical History  Diagnosis Date  . Breast cancer 2002  . Anxiety   . Gall stones   . Fall     last week  . Vertigo   . Panic attacks     while driving or in the front seat of the car; pt likes to ride in the back seat   Past Surgical History  Procedure Laterality Date  . Bilateral mastectomy  2002  . C section  1997 2001    x2  . Tubal ligation  2001  . Abdominal hysterectomy  2002  . Breast enhancement surgery  2003    bilateral  . Breast biopsy Left 2002  . Colonoscopy w/ polypectomy    . Cholecystectomy N/A 07/28/2014    Procedure: LAPAROSCOPIC CHOLECYSTECTOMY WITH INTRAOPERATIVE CHOLANGIOGRAM;  Surgeon: Donnie Mesa, MD;  Location: [facility];  Service: General;  Laterality: N/A;   Family History  Problem Relation Age of Onset  . Uterine cancer Mother   . Ovarian cancer Mother   . Breast cancer Sister   . Esophageal cancer Brother   . Breast cancer  Maternal Grandmother   . Colon cancer Neg Hx   . Rectal cancer Neg Hx   . Stomach cancer Neg Hx    Social History  Substance Use Topics  . Smoking status: Former Smoker    Quit date: 04/17/1979  . Smokeless tobacco: Never Used  . Alcohol Use: 0.6 oz/week    1 Cans of beer per week     Comment: weekly   OB History    No data available     Review of Systems  10 systems reviewed and found to be negative, except as noted in the HPI.  Allergies  Review of patient's allergies indicates no known allergies.  Home Medications   Prior to Admission medications   Medication Sig Start Date End Date Taking? Authorizing Provider  ALPRAZolam (XANAX) 0.25 MG tablet Take 0.125-0.25 mg by mouth daily as needed for anxiety.  01/24/14   Historical Provider, MD  Aspirin-Acetaminophen-Caffeine (GOODY HEADACHE PO) Take 1 packet by mouth daily as needed (for pain).    Historical Provider, MD  OVER THE COUNTER MEDICATION Place 1 drop into both eyes as needed (for contacts). "Blink Contacts"    Historical Provider, MD  oxyCODONE-acetaminophen (PERCOCET/ROXICET) 5-325 MG per tablet Take 1 tablet by mouth every 4 (four) hours as needed for severe pain. 07/28/14  Donnie Mesa, MD  sertraline (ZOLOFT) 25 MG tablet Take 25 mg by mouth daily.  12/28/13   Historical Provider, MD   BP 96/56 mmHg  Pulse 71  Temp(Src) 97.8 F (36.6 C) (Oral)  Resp 18  SpO2 98% Physical Exam  Constitutional: She is oriented to person, place, and time. She appears well-developed and well-nourished. No distress.  HENT:  Head: Normocephalic and atraumatic.  Mouth/Throat: Oropharynx is clear and moist.  No abrasions or contusions.   No hemotympanum, battle signs or raccoon's eyes  No crepitance or tenderness to palpation along the orbital rim.  EOMI intact with no pain or diplopia  No abnormal otorrhea or rhinorrhea. Nasal septum midline.  No intraoral trauma.      Eyes: Conjunctivae and EOM are normal. Pupils are  equal, round, and reactive to light.  No TTP of maxillary or frontal sinuses  No TTP or induration of temporal arteries bilaterally  Neck: Normal range of motion. Neck supple.  FROM to C-spine. Pt can touch chin to chest without discomfort. No TTP of midline cervical spine.   Cardiovascular: Normal rate, regular rhythm and intact distal pulses.   Pulmonary/Chest: Effort normal and breath sounds normal. No stridor. No respiratory distress. She has no wheezes. She has no rales. She exhibits no tenderness.  Abdominal: Soft. Bowel sounds are normal. There is no tenderness.  Musculoskeletal: Normal range of motion. She exhibits no edema or tenderness.  Neurological: She is alert and oriented to person, place, and time. No cranial nerve deficit.  II-Visual fields grossly intact. III/IV/VI-Extraocular movements intact.  Pupils reactive bilaterally. V/VII-Smile symmetric, equal eyebrow raise,  facial sensation intact VIII- Hearing grossly intact IX/X-Normal gag XI-bilateral shoulder shrug XII-midline tongue extension Motor: 5/5 bilaterally with normal tone and bulk Cerebellar: Normal finger-to-nose  and normal heel-to-shin test.   Romberg negative Ambulates with a coordinated gait   Psychiatric: She has a normal mood and affect.  Nursing note and vitals reviewed.   ED Course  Procedures (including critical care time) Labs Review Labs Reviewed - No data to display  Imaging Review No results found. I have personally reviewed and evaluated these images and lab results as part of my medical decision-making.   EKG Interpretation None      MDM   Final diagnoses:  Concussion, without loss of consciousness, initial encounter    Filed Vitals:   11/10/15 1430 11/10/15 1813  BP: 96/56 116/73  Pulse: 71 63  Temp: 97.8 F (36.6 C) 97.9 F (36.6 C)  TempSrc: Oral Oral  Resp: 18 18  SpO2: 98% 98%    Medications  prochlorperazine (COMPAZINE) tablet 10 mg (not administered)   ibuprofen (ADVIL,MOTRIN) tablet 400 mg (not administered)  oxyCODONE-acetaminophen (PERCOCET/ROXICET) 5-325 MG per tablet 1 tablet (1 tablet Oral Given 11/10/15 1700)    Connie Ochoa is 55 y.o. female presenting with low impact head trauma several days ago. Patient has nausea with no vomiting, nonfocal neuro exam however, patient states that she has experienced some ataxia home and is having to hold onto objects in order to walk and not fall. Will CT head and give Compazine and Motrin for headache.  CT negative for acute abnormality, discussed concussion precautions with patient who verbalized her understanding, she will follow with her primary care physician  Evaluation does not show pathology that would require ongoing emergent intervention or inpatient treatment. Pt is hemodynamically stable and mentating appropriately. Discussed findings and plan with patient/guardian, who agrees with care plan. All questions answered. Return precautions  discussed and outpatient follow up given.   Discharge Medication List as of 11/10/2015  7:03 PM    START taking these medications   Details  butalbital-acetaminophen-caffeine (FIORICET) 50-325-40 MG tablet Take 1 tablet by mouth every 6 (six) hours as needed for headache., Starting 11/10/2015, Until Discontinued, Print            Monico Blitz, PA-C 11/10/15 2012  Varney Biles, MD 11/11/15 0022

## 2017-01-08 ENCOUNTER — Encounter: Payer: Self-pay | Admitting: Gastroenterology

## 2017-12-14 ENCOUNTER — Other Ambulatory Visit: Payer: Self-pay

## 2017-12-14 ENCOUNTER — Encounter (HOSPITAL_COMMUNITY): Payer: Self-pay | Admitting: Emergency Medicine

## 2017-12-14 DIAGNOSIS — Z5321 Procedure and treatment not carried out due to patient leaving prior to being seen by health care provider: Secondary | ICD-10-CM | POA: Insufficient documentation

## 2017-12-14 DIAGNOSIS — R42 Dizziness and giddiness: Secondary | ICD-10-CM | POA: Insufficient documentation

## 2017-12-14 MED ORDER — IBUPROFEN 400 MG PO TABS
400.0000 mg | ORAL_TABLET | Freq: Once | ORAL | Status: AC | PRN
  Administered 2017-12-14: 400 mg via ORAL
  Filled 2017-12-14: qty 1

## 2017-12-14 NOTE — ED Triage Notes (Addendum)
Pt had a mechanical fall trying to get into her tub.  She hit her head and has an abrasion and pain to the left side of her face w/ minor swelling.  Pt Reports dizziness now, denies n/v/loc.  Pt also reports neck pain.  Collar placed

## 2017-12-15 ENCOUNTER — Emergency Department (HOSPITAL_COMMUNITY)
Admission: EM | Admit: 2017-12-15 | Discharge: 2017-12-15 | Disposition: A | Discharge status: Home / Self Care | Payer: 59 | Attending: Emergency Medicine | Admitting: Emergency Medicine

## 2020-10-03 ENCOUNTER — Ambulatory Visit: Payer: BC Managed Care – PPO | Admitting: Cardiology

## 2020-10-03 ENCOUNTER — Encounter: Payer: Self-pay | Admitting: Cardiology

## 2020-10-03 ENCOUNTER — Other Ambulatory Visit: Payer: Self-pay

## 2020-10-03 VITALS — BP 117/68 | HR 81 | Ht 64.0 in | Wt 185.6 lb

## 2020-10-03 DIAGNOSIS — Z853 Personal history of malignant neoplasm of breast: Secondary | ICD-10-CM

## 2020-10-03 DIAGNOSIS — R9431 Abnormal electrocardiogram [ECG] [EKG]: Secondary | ICD-10-CM

## 2020-10-03 DIAGNOSIS — Z87891 Personal history of nicotine dependence: Secondary | ICD-10-CM

## 2020-10-03 NOTE — Progress Notes (Signed)
Date:  10/03/2020   ID:  Connie Ochoa, DOB Jan 15, 1961, MRN [id number]  PCP:  Bernerd Limbo, MD  Cardiologist:  Rex Kras, DO, [facility] (established care 10/04/2019)  REASON FOR CONSULT: Abnormal EKG.   REQUESTING PHYSICIAN:  Leighton Ruff, [place],  [place] [id number]  Chief Complaint  Patient presents with  . New Patient (Initial Visit)  . Abnormal ECG    HPI  Connie Ochoa is a 60 y.o. female who presents to the office with a chief complaint of "abnormal ekg." Patient's past medical history and cardiovascular risk factors include: Hx of Left Breast Cancer s/p bilateral mastectomy & finished chemotherapy 2002.    She is referred to the office at the request of Leighton Ruff, MD for evaluation of abnormal EKG.  Patient recently went to her PCP for yearly physical and was noted to have an abnormal EKG.  Therefore was referred to cardiology for further evaluation and management.  She denies any chest pain or shortness of breath at rest or with effort related activities.  She denies orthopnea, paroxysmal nocturnal dyspnea, or lower extremity swelling.  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  FUNCTIONAL STATUS: No structured exercise program or daily routine.   ALLERGIES: Allergies  Allergen Reactions  . Shrimp [Shellfish Allergy] Itching    MEDICATION LIST PRIOR TO VISIT: Current Meds  Medication Sig  . alendronate (FOSAMAX) 70 MG tablet Take by mouth.  . ALPRAZolam (XANAX) 0.25 MG tablet Take 0.125-0.25 mg by mouth daily as needed for anxiety.   . Aspirin-Acetaminophen-Caffeine (GOODY HEADACHE PO) Take 1 packet by mouth daily as needed (for pain).  Marland Kitchen OVER THE COUNTER MEDICATION Place 1 drop into both eyes as needed (for contacts). "Blink Contacts"   . PARoxetine (PAXIL) 10 MG tablet Take 10 mg by mouth daily.  . valACYclovir (VALTREX) 1000 MG tablet Take 2 tablets by mouth every 12 (twelve) hours. Cold sore outbreak.  . Vitamin D, Ergocalciferol, (DRISDOL) 50000 units CAPS capsule Take 1 capsule by mouth every 7 (seven) days.     PAST MEDICAL HISTORY: Past Medical History:  Diagnosis Date  . Anxiety   . Breast cancer ([place]) 2002  . Fall    last week  . Gall stones   . Osteoporosis   . Panic attacks    while driving or in the front seat of the car; pt likes to ride in the back seat  . Vertigo     PAST SURGICAL HISTORY: Past Surgical History:  Procedure Laterality Date  . ABDOMINAL HYSTERECTOMY  2002  . bilateral mastectomy  2002  . BREAST BIOPSY Left 2002  . BREAST ENHANCEMENT SURGERY  2003   bilateral  . c section  1997 2001   x2  . CHOLECYSTECTOMY N/A 07/28/2014   Procedure: LAPAROSCOPIC CHOLECYSTECTOMY WITH INTRAOPERATIVE CHOLANGIOGRAM;  Surgeon: Donnie Mesa, MD;  Location: [facility];  Service: General;  Laterality: N/A;  . COLONOSCOPY W/ POLYPECTOMY    . TUBAL LIGATION  2001    FAMILY HISTORY: The patient family history includes Breast cancer in her maternal grandmother and sister; Esophageal cancer in her brother; Heart failure in her father; Ovarian cancer in her mother; Uterine cancer in her mother.  SOCIAL HISTORY:  The patient  reports that she quit smoking about 41 years ago. She has  a 0.25 pack-year smoking history. She has never used smokeless tobacco. She reports current alcohol use of about 1.0 standard drink of alcohol per week. She reports that she does not use drugs.  REVIEW OF SYSTEMS: Review of Systems  Constitutional: Positive for malaise/fatigue. Negative for chills and fever.  HENT: Negative for hoarse voice and nosebleeds.   Eyes: Negative for discharge, double vision and pain.  Cardiovascular: Negative for chest pain, claudication, dyspnea on exertion, leg swelling, near-syncope, orthopnea,  palpitations, paroxysmal nocturnal dyspnea and syncope.  Respiratory: Negative for hemoptysis and shortness of breath.   Musculoskeletal: Negative for muscle cramps and myalgias.  Gastrointestinal: Negative for abdominal pain, constipation, diarrhea, hematemesis, hematochezia, melena, nausea and vomiting.  Neurological: Negative for dizziness and light-headedness.   PHYSICAL EXAM: Vitals with BMI 10/03/2020 12/14/2017 11/10/2015  Height $Remov'5\' 4"'oKyLwb$  - $'5\' 4"'I$   Weight 185 lbs 10 oz - 192 lbs  BMI 02.54 - 33  Systolic 270 623 95  Diastolic 68 82 67  Pulse 81 73 61    CONSTITUTIONAL: Well-developed and well-nourished. No acute distress.  SKIN: Skin is warm and dry. No rash noted. No cyanosis. No pallor. No jaundice HEAD: Normocephalic and atraumatic.  EYES: No scleral icterus MOUTH/THROAT: Moist oral membranes.  NECK: No JVD present. No thyromegaly noted. No carotid bruits  LYMPHATIC: No visible cervical adenopathy.  CHEST Normal respiratory effort. No intercostal retractions  LUNGS: Clear to auscultation bilaterally. No stridor. No wheezes. No rales.  CARDIOVASCULAR: Regular rate and rhythm, positive S1-S2, no murmurs rubs or gallops appreciated. ABDOMINAL: No apparent ascites.  EXTREMITIES: No peripheral edema.  HEMATOLOGIC: No significant bruising.  NEUROLOGIC: Oriented to person, place, and time. Nonfocal. Normal muscle tone.  PSYCHIATRIC: Normal mood and affect. Normal behavior. Cooperative  CARDIAC DATABASE: EKG: 10/03/2020: Normal sinus rhythm, 76 bpm, diffuse low voltage, old inferior infarct, poor R wave progression, nonspecific T wave abnormality.  Echocardiogram: No results found for this or any previous visit from the past 1095 days.   Stress Testing: No results found for this or any previous visit from the past 1095 days.  Heart Catheterization: None  LABORATORY DATA: External Labs: Collected: 09/29/2020 from care everywhere Hemoglobin 12.8 g/dL, hematocrit  37.6% Creatinine 0.62 mg/dL. eGFR: 98 mL/min per 1.73 m Sodium 143, potassium 4.2, chloride 106, bicarb 23 AST 21, ALT 19, alkaline phosphatase 86  Lipid profile: Collected: 09/07/2020 from care everywhere Total cholesterol 223, triglycerides 126, HDL 61, LDL 140.   IMPRESSION:    ICD-10-CM   1. Nonspecific abnormal electrocardiogram (ECG) (EKG)  R94.31 EKG 12-Lead    PCV MYOCARDIAL PERFUSION WO LEXISCAN    ECHOCARDIOGRAM COMPLETE    SARS-COV-2 RNA,(COVID-19) QUAL NAAT  2. Hx of breast cancer  Z85.3   3. Former smoker  Z87.891   4. Abnormal electrocardiogram  R94.31 PCV MYOCARDIAL PERFUSION WO LEXISCAN     RECOMMENDATIONS: Connie Ochoa is a 60 y.o. female whose past medical history and cardiac risk factors include: Hx of Left Breast Cancer s/p bilateral mastectomy & finished chemotherapy 2002.    Abnormal EKG:  Patient is overall asymptomatic.  But EKG notes normal sinus rhythm, with diffuse low voltage and possible old inferior infarct.  Low voltage may be secondary to her history of chemotherapy given her hx of lleft-sided breast cancer.  Patient is able to exercise but given the abnormal EKG would recommend exercise nuclear stress test.  Covid screen prior to stress testing  The left ventricular ejection fraction or pumping activity of the heart is within  the normal limit. Details will be reviewed at the next office visit.  History of breast cancer and chemotherapy:  Echocardiogram will be ordered to evaluate for structural heart disease and left ventricular systolic function.  Former smoker: Educated on the importance of continued smoking cessation.   FINAL MEDICATION LIST END OF ENCOUNTER: No orders of the defined types were placed in this encounter.    Current Outpatient Medications:  .  alendronate (FOSAMAX) 70 MG tablet, Take by mouth., Disp: , Rfl:  .  ALPRAZolam (XANAX) 0.25 MG tablet, Take 0.125-0.25 mg by mouth daily as needed for anxiety. , Disp: ,  Rfl:  .  Aspirin-Acetaminophen-Caffeine (GOODY HEADACHE PO), Take 1 packet by mouth daily as needed (for pain)., Disp: , Rfl:  .  OVER THE COUNTER MEDICATION, Place 1 drop into both eyes as needed (for contacts). "Blink Contacts", Disp: , Rfl:  .  PARoxetine (PAXIL) 10 MG tablet, Take 10 mg by mouth daily., Disp: , Rfl:  .  valACYclovir (VALTREX) 1000 MG tablet, Take 2 tablets by mouth every 12 (twelve) hours. Cold sore outbreak., Disp: , Rfl: 1 .  Vitamin D, Ergocalciferol, (DRISDOL) 50000 units CAPS capsule, Take 1 capsule by mouth every 7 (seven) days., Disp: , Rfl: 0  Orders Placed This Encounter  Procedures  . SARS-COV-2 RNA,(COVID-19) QUAL NAAT  . PCV MYOCARDIAL PERFUSION WO LEXISCAN  . EKG 12-Lead  . ECHOCARDIOGRAM COMPLETE   There are no Patient Instructions on file for this visit.   --Continue cardiac medications as reconciled in final medication list. --Return in about 4 weeks (around 10/31/2020) for Review test results. Or sooner if needed. --Continue follow-up with your primary care physician regarding the management of your other chronic comorbid conditions.  Patient's questions and concerns were addressed to her satisfaction. She voices understanding of the instructions provided during this encounter.   This note was created using a voice recognition software as a result there may be grammatical errors inadvertently enclosed that do not reflect the nature of this encounter. Every attempt is made to correct such errors.  Rex Kras, [place], [facility]  Pager: [phone] Office: [phone]

## 2020-10-07 ENCOUNTER — Other Ambulatory Visit (HOSPITAL_COMMUNITY): Payer: Self-pay

## 2020-10-10 ENCOUNTER — Other Ambulatory Visit: Payer: BC Managed Care – PPO

## 2020-10-11 ENCOUNTER — Ambulatory Visit (HOSPITAL_COMMUNITY): Admission: RE | Admit: 2020-10-11 | Payer: BC Managed Care – PPO | Source: Ambulatory Visit

## 2020-10-14 ENCOUNTER — Ambulatory Visit: Payer: BC Managed Care – PPO

## 2020-10-14 ENCOUNTER — Other Ambulatory Visit: Payer: Self-pay

## 2020-10-14 DIAGNOSIS — R9431 Abnormal electrocardiogram [ECG] [EKG]: Secondary | ICD-10-CM

## 2020-10-17 ENCOUNTER — Other Ambulatory Visit: Payer: Self-pay

## 2020-10-17 ENCOUNTER — Ambulatory Visit (HOSPITAL_COMMUNITY)
Admission: RE | Admit: 2020-10-17 | Discharge: 2020-10-17 | Disposition: A | Discharge status: Home / Self Care | Payer: BC Managed Care – PPO | Source: Ambulatory Visit | Attending: Cardiology | Admitting: Cardiology

## 2020-10-17 DIAGNOSIS — Z853 Personal history of malignant neoplasm of breast: Secondary | ICD-10-CM | POA: Insufficient documentation

## 2020-10-17 DIAGNOSIS — R9431 Abnormal electrocardiogram [ECG] [EKG]: Secondary | ICD-10-CM | POA: Insufficient documentation

## 2020-10-17 DIAGNOSIS — I358 Other nonrheumatic aortic valve disorders: Secondary | ICD-10-CM | POA: Insufficient documentation

## 2020-10-17 NOTE — Progress Notes (Signed)
  Echocardiogram 2D Echocardiogram has been performed.  Connie Ochoa M 10/17/2020, 8:52 AM

## 2020-10-21 DIAGNOSIS — R9431 Abnormal electrocardiogram [ECG] [EKG]: Secondary | ICD-10-CM | POA: Insufficient documentation

## 2020-10-21 LAB — ECHOCARDIOGRAM COMPLETE
Area-P 1/2: 3.12 cm2
Calc EF: 52.6 %
S' Lateral: 3 cm
Single Plane A2C EF: 54.6 %
Single Plane A4C EF: 49.2 %

## 2020-10-26 ENCOUNTER — Ambulatory Visit: Payer: BC Managed Care – PPO | Admitting: Cardiology

## 2020-10-26 ENCOUNTER — Encounter: Payer: Self-pay | Admitting: Cardiology

## 2020-10-26 ENCOUNTER — Other Ambulatory Visit: Payer: Self-pay

## 2020-10-26 VITALS — BP 112/70 | HR 77 | Temp 97.7°F | Resp 16 | Ht 64.0 in | Wt 181.2 lb

## 2020-10-26 DIAGNOSIS — R9431 Abnormal electrocardiogram [ECG] [EKG]: Secondary | ICD-10-CM

## 2020-10-26 DIAGNOSIS — E7800 Pure hypercholesterolemia, unspecified: Secondary | ICD-10-CM

## 2020-10-26 DIAGNOSIS — Z712 Person consulting for explanation of examination or test findings: Secondary | ICD-10-CM

## 2020-10-26 DIAGNOSIS — Z87891 Personal history of nicotine dependence: Secondary | ICD-10-CM

## 2020-10-26 DIAGNOSIS — Z853 Personal history of malignant neoplasm of breast: Secondary | ICD-10-CM

## 2020-10-26 NOTE — Progress Notes (Signed)
Date:  10/26/2020   ID:  Connie Ochoa, DOB 09/18/1961, MRN [id number]  PCP:  Bernerd Limbo, MD  Cardiologist:  Rex Kras, DO, [facility] (established care 10/04/2019)  Date: 10/26/20 Last Office Visit: 10/03/2020  Chief Complaint  Patient presents with  . Results  . Follow-up    4 weeks    HPI  Connie Ochoa is a 60 y.o. female who presents to the office with a chief complaint of "4-week follow-up to review test results." Patient's past medical history and cardiovascular risk factors include: Hx of Left Breast Cancer s/p bilateral mastectomy & finished chemotherapy 2002.    She is referred to the office at the request of Bernerd Limbo, MD for evaluation of abnormal EKG.  Patient recently went to her PCP for yearly physical and was noted to have an abnormal EKG.  Therefore was referred to cardiology for further evaluation and management.  She denies any chest pain or shortness of breath at rest or with effort related activities.  She denies orthopnea, paroxysmal nocturnal dyspnea, or lower extremity swelling.  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  Patient is EKG on initial consultation noted low voltage as well as possible old inferior infarct.  She underwent stress test and echocardiogram with strain since last office visit.  Results were discussed with her in great detail and noted below for further reference.  FUNCTIONAL STATUS: No structured exercise program or daily routine.   ALLERGIES: Allergies  Allergen Reactions  . Shrimp [Shellfish Allergy] Itching    MEDICATION LIST PRIOR TO VISIT: Current Meds  Medication Sig  . ALPRAZolam (XANAX) 0.25 MG tablet Take 0.125-0.25 mg by mouth daily as needed for anxiety.   . Aspirin-Acetaminophen-Caffeine (GOODY HEADACHE PO) Take 1  packet by mouth daily as needed (for pain).  Marland Kitchen OVER THE COUNTER MEDICATION Place 1 drop into both eyes as needed (for contacts). "Blink Contacts"  . PARoxetine (PAXIL) 10 MG tablet Take 10 mg by mouth daily.  . valACYclovir (VALTREX) 1000 MG tablet Take 2 tablets by mouth every 12 (twelve) hours. Cold sore outbreak.  . Vitamin D, Ergocalciferol, (DRISDOL) 50000 units CAPS capsule Take 1 capsule by mouth every 7 (seven) days.     PAST MEDICAL HISTORY: Past Medical History:  Diagnosis Date  . Anxiety   . Breast cancer ([place]) 2002  . Fall    last week  . Gall stones   . Osteoporosis   . Panic attacks    while driving or in the front seat of the car; pt likes to ride in the back seat  . Vertigo     PAST SURGICAL HISTORY: Past Surgical History:  Procedure Laterality Date  . ABDOMINAL HYSTERECTOMY  2002  . bilateral mastectomy  2002  . BREAST BIOPSY Left 2002  . BREAST ENHANCEMENT SURGERY  2003   bilateral  . c section  1997 2001   x2  . CHOLECYSTECTOMY N/A 07/28/2014   Procedure: LAPAROSCOPIC CHOLECYSTECTOMY WITH INTRAOPERATIVE CHOLANGIOGRAM;  Surgeon: Donnie Mesa, MD;  Location: [facility];  Service: General;  Laterality: N/A;  . COLONOSCOPY W/ POLYPECTOMY    . TUBAL LIGATION  2001    FAMILY HISTORY: The patient family history includes Breast cancer in her maternal grandmother and sister; Esophageal cancer in her brother; Heart failure in her father; Ovarian cancer in  her mother; Uterine cancer in her mother.  SOCIAL HISTORY:  The patient  reports that she quit smoking about 51 years ago. Her smoking use included cigarettes. She has a 0.25 pack-year smoking history. She has never used smokeless tobacco. She reports current alcohol use of about 1.0 standard drink of alcohol per week. She reports that she does not use drugs.  REVIEW OF SYSTEMS: Review of Systems  Constitutional: Negative for chills and fever.  HENT: Negative for hoarse voice and nosebleeds.   Eyes: Negative for  discharge, double vision and pain.  Cardiovascular: Negative for chest pain, claudication, dyspnea on exertion, leg swelling, near-syncope, orthopnea, palpitations, paroxysmal nocturnal dyspnea and syncope.  Respiratory: Negative for hemoptysis and shortness of breath.   Musculoskeletal: Negative for muscle cramps and myalgias.  Gastrointestinal: Negative for abdominal pain, constipation, diarrhea, hematemesis, hematochezia, melena, nausea and vomiting.  Neurological: Negative for dizziness and light-headedness.   PHYSICAL EXAM: Vitals with BMI 10/26/2020 10/03/2020 12/14/2017  Height $Remov'5\' 4"'qVmjei$  $Remove'5\' 4"'HioriPX$  -  Weight 181 lbs 3 oz 185 lbs 10 oz -  BMI 67.20 94.70 -  Systolic 962 836 629  Diastolic 70 68 82  Pulse 77 81 73    CONSTITUTIONAL: Well-developed and well-nourished. No acute distress.  SKIN: Skin is warm and dry. No rash noted. No cyanosis. No pallor. No jaundice HEAD: Normocephalic and atraumatic.  EYES: No scleral icterus MOUTH/THROAT: Moist oral membranes.  NECK: No JVD present. No thyromegaly noted. No carotid bruits  LYMPHATIC: No visible cervical adenopathy.  CHEST Normal respiratory effort. No intercostal retractions  LUNGS: Clear to auscultation bilaterally. No stridor. No wheezes. No rales.  CARDIOVASCULAR: Regular rate and rhythm, positive S1-S2, no murmurs rubs or gallops appreciated. ABDOMINAL: No apparent ascites.  EXTREMITIES: No peripheral edema.  HEMATOLOGIC: No significant bruising.  NEUROLOGIC: Oriented to person, place, and time. Nonfocal. Normal muscle tone.  PSYCHIATRIC: Normal mood and affect. Normal behavior. Cooperative  CARDIAC DATABASE: EKG: 10/03/2020: Normal sinus rhythm, 76 bpm, diffuse low voltage, old inferior infarct, poor R wave progression, nonspecific T wave abnormality.  Echocardiogram: 10/17/2020:  1. Left ventricular ejection fraction, by estimation, is 55 to 60%. The left ventricle has normal function. The left ventricle has no regional wall  motion abnormalities. Left ventricular diastolic parameters were normal. The average left ventricular  global longitudinal strain is -14.6 %. The global longitudinal strain is abnormal.  2. Right ventricular systolic function is mildly reduced. The right ventricular size is normal.  3. The mitral valve is normal in structure. No evidence of mitral valve regurgitation. No evidence of mitral stenosis.  4. The aortic valve is tricuspid. Aortic valve regurgitation is not visualized. Mild aortic valve sclerosis is present, with no evidence of aortic valve stenosis.  5. The inferior vena cava is normal in size with greater than 50% respiratory variability, suggesting right atrial pressure of 3 mmHg.   Stress Testing: Exercise Sestamibi Stress Test 10/14/2020: Normal ECG stress. patient exercised for 8 minutes and 0 seconds of a Bruce protocol, achieving approximately 10.13 METs. Achieved 107% of the maximum predicted heart rate response. No symptoms. Myocardial perfusion is normal.  Overall LV systolic function is normal without regional wall motion abnormalities. Stress LV EF: 58%.  No previous exam available for comparison. Low risk  Heart Catheterization: None  LABORATORY DATA: External Labs: Collected: 09/29/2020 from care everywhere Hemoglobin 12.8 g/dL, hematocrit 37.6% Creatinine 0.62 mg/dL. eGFR: 98 mL/min per 1.73 m Sodium 143, potassium 4.2, chloride 106, bicarb 23 AST 21, ALT 19, alkaline phosphatase  86  Lipid profile: Collected: 09/07/2020 from care everywhere Total cholesterol 223, triglycerides 126, HDL 61, LDL 140.   IMPRESSION:    ICD-10-CM   1. Nonspecific abnormal electrocardiogram (ECG) (EKG)  R94.31   2. Hx of breast cancer  Z85.3   3. Former smoker  Z87.891   4. Encounter to discuss test results  Z71.2   5. Pure hypercholesterolemia  E78.00 CT CARDIAC SCORING (SELF PAY ONLY)     RECOMMENDATIONS: Connie Ochoa is a 60 y.o. female whose past medical history  and cardiac risk factors include: Hx of Left Breast Cancer s/p bilateral mastectomy & finished chemotherapy 2002.    Patient was referred to the office for further work-up given her abnormal EKG findings.  During initial consultation an EKG was performed which noted normal sinus rhythm, with diffuse low voltage and possible old inferior infarct.  Given the diffuse low voltage there was concern for either infiltrative cardiomyopathy or may be secondary to the chemotherapy she received for her breast cancer in the past.  Echocardiogram was performed which noted preserved LVEF, normal diastolic function, but she does have abnormal strain.  Strain pattern is not consistent with amyloidosis. Monitor for now.   Patient also underwent exercise stress test.  She achieved 10.1 METS, 107% of maximum predicted heart rate, and myocardial perfusion was normal.  From a cardiovascular standpoint no additional cardiovascular testing needed at this time.  Patient's estimated 10-year risk of ASCVD is approximately 2.6%.  No statin therapy recommended but educated on the importance of reducing her LDL with lifestyle modifications.  However the shared decision was to proceed with coronary calcium score for further risk stratification.  Educated on importance of primary prevention for CAD with heart healthy lifestyle, focusing on moderate intensity exercise for 30 minutes a day 5 days a week as tolerated, continued smoking cessation.   FINAL MEDICATION LIST END OF ENCOUNTER: No orders of the defined types were placed in this encounter.    Current Outpatient Medications:  .  ALPRAZolam (XANAX) 0.25 MG tablet, Take 0.125-0.25 mg by mouth daily as needed for anxiety. , Disp: , Rfl:  .  Aspirin-Acetaminophen-Caffeine (GOODY HEADACHE PO), Take 1 packet by mouth daily as needed (for pain)., Disp: , Rfl:  .  OVER THE COUNTER MEDICATION, Place 1 drop into both eyes as needed (for contacts). "Blink Contacts", Disp: , Rfl:   .  PARoxetine (PAXIL) 10 MG tablet, Take 10 mg by mouth daily., Disp: , Rfl:  .  valACYclovir (VALTREX) 1000 MG tablet, Take 2 tablets by mouth every 12 (twelve) hours. Cold sore outbreak., Disp: , Rfl: 1 .  Vitamin D, Ergocalciferol, (DRISDOL) 50000 units CAPS capsule, Take 1 capsule by mouth every 7 (seven) days., Disp: , Rfl: 0  Orders Placed This Encounter  Procedures  . CT CARDIAC SCORING (SELF PAY ONLY)   There are no Patient Instructions on file for this visit.   --Continue cardiac medications as reconciled in final medication list. --Return in about 4 weeks (around 11/23/2020). Or sooner if needed. --Continue follow-up with your primary care physician regarding the management of your other chronic comorbid conditions.  Patient's questions and concerns were addressed to her satisfaction. She voices understanding of the instructions provided during this encounter.   This note was created using a voice recognition software as a result there may be grammatical errors inadvertently enclosed that do not reflect the nature of this encounter. Every attempt is made to correct such errors.  Total time: 30 minutes.  Rex Kras, [place], [facility]  Pager: [phone] Office: [phone]

## 2020-10-27 ENCOUNTER — Other Ambulatory Visit: Payer: Self-pay

## 2020-11-08 ENCOUNTER — Ambulatory Visit (HOSPITAL_COMMUNITY)
Admission: RE | Admit: 2020-11-08 | Discharge: 2020-11-08 | Disposition: A | Discharge status: Home / Self Care | Payer: BC Managed Care – PPO | Source: Ambulatory Visit | Attending: Family Medicine | Admitting: Family Medicine

## 2020-11-08 ENCOUNTER — Other Ambulatory Visit: Payer: Self-pay

## 2020-11-08 DIAGNOSIS — E7800 Pure hypercholesterolemia, unspecified: Secondary | ICD-10-CM

## 2020-11-09 DIAGNOSIS — E7800 Pure hypercholesterolemia, unspecified: Secondary | ICD-10-CM | POA: Insufficient documentation

## 2020-11-24 ENCOUNTER — Ambulatory Visit: Payer: BC Managed Care – PPO | Admitting: Cardiology

## 2020-11-24 ENCOUNTER — Encounter: Payer: Self-pay | Admitting: Cardiology

## 2020-11-24 ENCOUNTER — Other Ambulatory Visit: Payer: Self-pay

## 2020-11-24 VITALS — BP 118/69 | HR 85 | Temp 98.5°F | Resp 16 | Ht 64.0 in | Wt 183.5 lb

## 2020-11-24 DIAGNOSIS — Z853 Personal history of malignant neoplasm of breast: Secondary | ICD-10-CM

## 2020-11-24 DIAGNOSIS — E7800 Pure hypercholesterolemia, unspecified: Secondary | ICD-10-CM

## 2020-11-24 DIAGNOSIS — R9431 Abnormal electrocardiogram [ECG] [EKG]: Secondary | ICD-10-CM

## 2020-11-24 DIAGNOSIS — Z87891 Personal history of nicotine dependence: Secondary | ICD-10-CM

## 2020-11-24 NOTE — Progress Notes (Signed)
Date:  11/24/2020   ID:  Connie Ochoa, DOB 1961-07-16, MRN [id number]  PCP:  Bernerd Limbo, MD  Cardiologist:  Rex Kras, DO, [facility] (established care 10/04/2019)  Date: 11/24/20 Last Office Visit: 10/26/2020  Chief Complaint  Patient presents with  . Follow-up    4 week  . Results    echo    HPI  Connie Ochoa is a 60 y.o. female who presents to the office with a chief complaint of "4-week follow-up to review test results." Patient's past medical history and cardiovascular risk factors include: Hx of Left Breast Cancer s/p bilateral mastectomy & finished chemotherapy 2002.    She is referred to the office at the request of Bernerd Limbo, MD for evaluation of abnormal EKG.  Patient recently went to her PCP for yearly physical and was noted to have an abnormal EKG.  Therefore was referred to cardiology for further evaluation and management.  She denies any chest pain or shortness of breath at rest or with effort related activities.  She denies orthopnea, paroxysmal nocturnal dyspnea, or lower extremity swelling.  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  Patient is EKG on initial consultation noted low voltage as well as possible old inferior infarct.  She underwent stress test and echocardiogram with strain imaging results noted below.    At the last office visit we discussed risk factors modification and noticed that her LDL is not well controlled.  Patient was hesitant to start statin therapy and therefore we discussed undergoing coronary calcium score for further with stratification.  Since last visit she did undergo coronary artery calcium scoring.  Her total coronary calcium score is 0 with some scattered atherosclerosis noted in the descending thoracic aorta.    Clinically patient  is doing well from a cardiovascular standpoint.  FUNCTIONAL STATUS: No structured exercise program or daily routine.   ALLERGIES: Allergies  Allergen Reactions  . Shrimp [Shellfish Allergy] Itching    MEDICATION LIST PRIOR TO VISIT: Current Meds  Medication Sig  . ALPRAZolam (XANAX) 0.25 MG tablet Take 0.125-0.25 mg by mouth daily as needed for anxiety.   . Aspirin-Acetaminophen-Caffeine (GOODY HEADACHE PO) Take 1 packet by mouth daily as needed (for pain).  Marland Kitchen OVER THE COUNTER MEDICATION Place 1 drop into both eyes as needed (for contacts). "Blink Contacts"  . PARoxetine (PAXIL) 10 MG tablet Take 10 mg by mouth daily.  . valACYclovir (VALTREX) 1000 MG tablet Take 2 tablets by mouth every 12 (twelve) hours. Cold sore outbreak.  . Vitamin D, Ergocalciferol, (DRISDOL) 50000 units CAPS capsule Take 1 capsule by mouth every 7 (seven) days.     PAST MEDICAL HISTORY: Past Medical History:  Diagnosis Date  . Anxiety   . Breast cancer ([place]) 2002  . Fall    last week  . Gall stones   . Osteoporosis   . Panic attacks    while driving or in the front seat of the car; pt likes to ride in the back seat  . Vertigo     PAST SURGICAL HISTORY: Past Surgical History:  Procedure Laterality Date  . ABDOMINAL HYSTERECTOMY  2002  . bilateral mastectomy  2002  . BREAST BIOPSY Left 2002  . BREAST ENHANCEMENT SURGERY  2003   bilateral  . c section  1997 2001   x2  .  CHOLECYSTECTOMY N/A 07/28/2014   Procedure: LAPAROSCOPIC CHOLECYSTECTOMY WITH INTRAOPERATIVE CHOLANGIOGRAM;  Surgeon: Donnie Mesa, MD;  Location: [facility];  Service: General;  Laterality: N/A;  . COLONOSCOPY W/ POLYPECTOMY    . TUBAL LIGATION  2001    FAMILY HISTORY: The patient family history includes Breast cancer in her maternal grandmother and sister; Esophageal cancer in her brother; Heart failure in her father; Ovarian cancer in her mother; Uterine cancer in her mother.  SOCIAL HISTORY:  The patient  reports that she quit  smoking about 51 years ago. Her smoking use included cigarettes. She has a 0.25 pack-year smoking history. She has never used smokeless tobacco. She reports current alcohol use of about 1.0 standard drink of alcohol per week. She reports that she does not use drugs.  REVIEW OF SYSTEMS: Review of Systems  Constitutional: Negative for chills and fever.  HENT: Negative for hoarse voice and nosebleeds.   Eyes: Negative for discharge, double vision and pain.  Cardiovascular: Negative for chest pain, claudication, dyspnea on exertion, leg swelling, near-syncope, orthopnea, palpitations, paroxysmal nocturnal dyspnea and syncope.  Respiratory: Negative for hemoptysis and shortness of breath.   Musculoskeletal: Negative for muscle cramps and myalgias.  Gastrointestinal: Negative for abdominal pain, constipation, diarrhea, hematemesis, hematochezia, melena, nausea and vomiting.  Neurological: Negative for dizziness and light-headedness.   PHYSICAL EXAM: Vitals with BMI 11/24/2020 10/26/2020 10/03/2020  Height _0  _1  _2   Weight 183 lbs 8 oz 181 lbs 3 oz 185 lbs 10 oz  BMI 31.48 70.78 67.54  Systolic 492 010 071  Diastolic 69 70 68  Pulse 85 77 81    CONSTITUTIONAL: Well-developed and well-nourished. No acute distress.  SKIN: Skin is warm and dry. No rash noted. No cyanosis. No pallor. No jaundice HEAD: Normocephalic and atraumatic.  EYES: No scleral icterus MOUTH/THROAT: Moist oral membranes.  NECK: No JVD present. No thyromegaly noted. No carotid bruits  LYMPHATIC: No visible cervical adenopathy.  CHEST Normal respiratory effort. No intercostal retractions  LUNGS: Clear to auscultation bilaterally. No stridor. No wheezes. No rales.  CARDIOVASCULAR: Regular rate and rhythm, positive S1-S2, no murmurs rubs or gallops appreciated. ABDOMINAL: No apparent ascites.  EXTREMITIES: No peripheral edema.  HEMATOLOGIC: No significant bruising.  NEUROLOGIC: Oriented to person, place, and time.  Nonfocal. Normal muscle tone.  PSYCHIATRIC: Normal mood and affect. Normal behavior. Cooperative  CARDIAC DATABASE: EKG: 10/03/2020: Normal sinus rhythm, 76 bpm, diffuse low voltage, old inferior infarct, poor R wave progression, nonspecific T wave abnormality.  Echocardiogram: 10/17/2020:  1. Left ventricular ejection fraction, by estimation, is 55 to 60%. The left ventricle has normal function. The left ventricle has no regional wall motion abnormalities. Left ventricular diastolic parameters were normal. The average left ventricular  global longitudinal strain is -14.6 %. The global longitudinal strain is abnormal.  2. Right ventricular systolic function is mildly reduced. The right ventricular size is normal.  3. The mitral valve is normal in structure. No evidence of mitral valve regurgitation. No evidence of mitral stenosis.  4. The aortic valve is tricuspid. Aortic valve regurgitation is not visualized. Mild aortic valve sclerosis is present, with no evidence of aortic valve stenosis.  5. The inferior vena cava is normal in size with greater than 50% respiratory variability, suggesting right atrial pressure of 3 mmHg.   Stress Testing: Exercise Sestamibi Stress Test 10/14/2020: Normal ECG stress. patient exercised for 8 minutes and 0 seconds of a Bruce protocol, achieving approximately 10.13 METs. Achieved 107% of the maximum predicted heart rate  response. No symptoms. Myocardial perfusion is normal.  Overall LV systolic function is normal without regional wall motion abnormalities. Stress LV EF: 58%.  No previous exam available for comparison. Low risk  Coronary calcium scoring 11/09/2020: Total coronary artery calcium score 0 Scattered atherosclerosis in the descending thoracic aorta.  Heart Catheterization: None  LABORATORY DATA: External Labs: Collected: 09/29/2020 from care everywhere Hemoglobin 12.8 g/dL, hematocrit 37.6% Creatinine 0.62 mg/dL. eGFR: 98 mL/min per 1.73  m Sodium 143, potassium 4.2, chloride 106, bicarb 23 AST 21, ALT 19, alkaline phosphatase 86  Lipid profile: Collected: 09/07/2020 from care everywhere Total cholesterol 223, triglycerides 126, HDL 61, LDL 140.   IMPRESSION:    ICD-10-CM   1. Nonspecific abnormal electrocardiogram (ECG) (EKG)  R94.31   2. Hx of breast cancer  Z85.3   3. Former smoker  Z87.891   4. Pure hypercholesterolemia  E78.00      RECOMMENDATIONS: Connie Ochoa is a 60 y.o. female whose past medical history and cardiac risk factors include: Hx of Left Breast Cancer s/p bilateral mastectomy & finished chemotherapy 2002.    Patient was referred to the office for further work-up given her abnormal EKG findings.  During initial consultation an EKG was performed which noted normal sinus rhythm, with diffuse low voltage and possible old inferior infarct.  Given the diffuse low voltage there was concern for either infiltrative cardiomyopathy or may be secondary to the chemotherapy she received for her breast cancer in the past.  Echocardiogram was performed which noted preserved LVEF, normal diastolic function, but she does have abnormal strain.  Strain pattern is not consistent with amyloidosis. Monitor for now.   Patient also underwent exercise stress test.  She achieved 10.1 METS, 107% of maximum predicted heart rate, and myocardial perfusion was normal.  During last office visit we discussed improving her modifiable cardiovascular risk factors and discussion regarding management of her hypercholesterolemia.  Her 10-year estimated risk of ASCVD was less than 5%.  However the shared decision was to proceed with coronary artery calcium scoring.  Since her last visit she did have a calcium score performed and the total calcium score is 0.  She does have scattered atherosclerotic plaque in the descending aorta.  Given the work-up, patient initially wanted to start statin therapy.  However reviewing the risks versus  benefit I recommended implementing lifestyle modifications by increasing physical activity and dietary restrictions and if her LDL improves would not recommend statin at this time.  However if the LDL continues to remain elevated a low intensity statin therapy can be considered as per the patient's goals of care.  Patient is stable for the care and the follow-up provided since initial consult.  I will see her back on a 1 year follow-up after her yearly physical to review her labs and symptoms.  FINAL MEDICATION LIST END OF ENCOUNTER: No orders of the defined types were placed in this encounter.    Current Outpatient Medications:  .  ALPRAZolam (XANAX) 0.25 MG tablet, Take 0.125-0.25 mg by mouth daily as needed for anxiety. , Disp: , Rfl:  .  Aspirin-Acetaminophen-Caffeine (GOODY HEADACHE PO), Take 1 packet by mouth daily as needed (for pain)., Disp: , Rfl:  .  OVER THE COUNTER MEDICATION, Place 1 drop into both eyes as needed (for contacts). "Blink Contacts", Disp: , Rfl:  .  PARoxetine (PAXIL) 10 MG tablet, Take 10 mg by mouth daily., Disp: , Rfl:  .  valACYclovir (VALTREX) 1000 MG tablet, Take 2 tablets by mouth  every 12 (twelve) hours. Cold sore outbreak., Disp: , Rfl: 1 .  Vitamin D, Ergocalciferol, (DRISDOL) 50000 units CAPS capsule, Take 1 capsule by mouth every 7 (seven) days., Disp: , Rfl: 0  No orders of the defined types were placed in this encounter.  There are no Patient Instructions on file for this visit.   --Continue cardiac medications as reconciled in final medication list. --Return in about 1 year (around 11/24/2021) for Follow up, Lipid. Or sooner if needed. --Continue follow-up with your primary care physician regarding the management of your other chronic comorbid conditions.  Patient's questions and concerns were addressed to her satisfaction. She voices understanding of the instructions provided during this encounter.   This note was created using a voice recognition  software as a result there may be grammatical errors inadvertently enclosed that do not reflect the nature of this encounter. Every attempt is made to correct such errors.  Total time: 25 minutes.   Rex Kras, [place], [facility]  Pager: [phone] Office: [phone]

## 2021-11-27 ENCOUNTER — Other Ambulatory Visit: Payer: Self-pay

## 2021-11-27 ENCOUNTER — Encounter: Payer: Self-pay | Admitting: Cardiology

## 2021-11-27 ENCOUNTER — Ambulatory Visit: Payer: BC Managed Care – PPO | Admitting: Cardiology

## 2021-11-27 VITALS — BP 101/70 | HR 76 | Temp 97.2°F | Resp 17 | Ht 64.0 in | Wt 141.0 lb

## 2021-11-27 DIAGNOSIS — Z853 Personal history of malignant neoplasm of breast: Secondary | ICD-10-CM

## 2021-11-27 DIAGNOSIS — Z87891 Personal history of nicotine dependence: Secondary | ICD-10-CM

## 2021-11-27 DIAGNOSIS — E7800 Pure hypercholesterolemia, unspecified: Secondary | ICD-10-CM

## 2021-11-27 DIAGNOSIS — R9431 Abnormal electrocardiogram [ECG] [EKG]: Secondary | ICD-10-CM

## 2021-11-27 NOTE — Progress Notes (Signed)
Date:  11/27/2021   ID:  Connie Ochoa, DOB Jul 18, 1961, MRN [id number]  PCP:  Connie Limbo, MD  Cardiologist:  Connie Kras, DO, [facility] (established care 10/04/2019)  Date: 11/27/21 Last Office Visit: 11/24/2020  Chief Complaint  Patient presents with   Follow-up    1 YEAR   Hyperlipidemia    HPI  Connie Ochoa is a 61 y.o. female who presents to the office with a chief complaint of "1 year follow-up lipid management." Patient's past medical history and cardiovascular risk factors include: Hx of Left Breast Cancer s/p bilateral mastectomy & finished chemotherapy 2002.    Initially referred to the practice for evaluation of abnormal EKG.  Since then patient has undergone an echocardiogram and stress test results reviewed as part of today's office visit and noted below for further reference.  She has history of breast cancer, BRCA positive, underwent bilateral mastectomy, had chemotherapy and 2002.  Other cardiovascular risk factors include hyperlipidemia, LDL not well controlled.  Patient is reluctant to be on statin therapy and therefore underwent coronary calcium score which was noted to be 0 with scattered atherosclerosis in the descending aorta.  She now presents for her annual follow-up visit.  Since last office visit patient has been started on [facility] and has increased her physical activity which is resulted in approximately 42 pounds of weight loss.  She is currently on Mounjaro with the intentions of stopping in the near future.  Most recent lipid profile notes a significant improvement in her LDL levels. She continues to exercise daily (walking 5 miles per day, jump ropes, and resistance training).   FUNCTIONAL STATUS: No structured exercise program or daily routine.   ALLERGIES: Allergies  Allergen Reactions   Shrimp [Shellfish Allergy] Itching    MEDICATION LIST PRIOR TO VISIT: Current Meds  Medication Sig   ALPRAZolam (XANAX) 0.25 MG tablet Take 0.125-0.25 mg by  mouth daily as needed for anxiety.    Aspirin-Acetaminophen-Caffeine (GOODY HEADACHE PO) Take 1 packet by mouth daily as needed (for pain).   B Complex-C (B-COMPLEX WITH VITAMIN C) tablet Take 1 tablet by mouth daily.   magnesium 30 MG tablet Take 1 tablet by mouth daily.   OVER THE COUNTER MEDICATION Place 1 drop into both eyes as needed (for contacts). "Blink Contacts"   tirzepatide (MOUNJARO) 15 MG/0.5ML Pen Inject 15 mg into the skin once a week.   tretinoin (RETIN-A) 2.992 % cream 1 application. as needed.   valACYclovir (VALTREX) 1000 MG tablet Take 2 tablets by mouth every 12 (twelve) hours. Cold sore outbreak.   Vitamin D, Ergocalciferol, (DRISDOL) 50000 units CAPS capsule Take 1 capsule by mouth every 7 (seven) days.     PAST MEDICAL HISTORY: Past Medical History:  Diagnosis Date   Anxiety    Breast cancer ([place]) 2002   Fall    last week   Gall stones    Osteoporosis    Panic attacks    while driving or in the front seat of the car; pt likes to ride in the back seat   Vertigo     PAST SURGICAL HISTORY: Past Surgical History:  Procedure Laterality Date   ABDOMINAL HYSTERECTOMY  2002   bilateral mastectomy  2002   BREAST BIOPSY Left 2002   BREAST ENHANCEMENT SURGERY  2003   bilateral   c section  1997 2001   x2   CHOLECYSTECTOMY N/A 07/28/2014   Procedure: LAPAROSCOPIC CHOLECYSTECTOMY WITH INTRAOPERATIVE CHOLANGIOGRAM;  Surgeon: Donnie Mesa, MD;  Location: [facility]  OR;  Service: General;  Laterality: N/A;   COLONOSCOPY W/ POLYPECTOMY     TUBAL LIGATION  2001    FAMILY HISTORY: The patient family history includes Breast cancer in her maternal grandmother and sister; Esophageal cancer in her brother; Heart failure in her father; Ovarian cancer in her mother; Uterine cancer in her mother.  SOCIAL HISTORY:  The patient  reports that she quit smoking about 42 years ago. Her smoking use included cigarettes. She has a 0.25 pack-year smoking history. She has never used smokeless  tobacco. She reports current alcohol use of about 1.0 standard drink per week. She reports that she does not use drugs.  REVIEW OF SYSTEMS: Review of Systems  Constitutional: Negative for chills and fever.  HENT:  Negative for hoarse voice and nosebleeds.   Eyes:  Negative for discharge, double vision and pain.  Cardiovascular:  Negative for chest pain, claudication, dyspnea on exertion, leg swelling, near-syncope, orthopnea, palpitations, paroxysmal nocturnal dyspnea and syncope.  Respiratory:  Negative for hemoptysis and shortness of breath.   Musculoskeletal:  Negative for muscle cramps and myalgias.  Gastrointestinal:  Negative for abdominal pain, constipation, diarrhea, hematemesis, hematochezia, melena, nausea and vomiting.  Neurological:  Negative for dizziness and light-headedness.   PHYSICAL EXAM: Vitals with BMI 11/27/2021 11/24/2020 10/26/2020  Height _0  _1  _2   Weight 141 lbs 183 lbs 8 oz 181 lbs 3 oz  BMI 24.19 40.81 44.81  Systolic 856 314 970  Diastolic 70 69 70  Pulse 76 85 77    CONSTITUTIONAL: Well-developed and well-nourished. No acute distress.  SKIN: Skin is warm and dry. No rash noted. No cyanosis. No pallor. No jaundice HEAD: Normocephalic and atraumatic.  EYES: No scleral icterus MOUTH/THROAT: Moist oral membranes.  NECK: No JVD present. No thyromegaly noted. No carotid bruits  LYMPHATIC: No visible cervical adenopathy.  CHEST Normal respiratory effort. No intercostal retractions  LUNGS: Clear to auscultation bilaterally. No stridor. No wheezes. No rales.  CARDIOVASCULAR: Regular rate and rhythm, positive S1-S2, no murmurs rubs or gallops appreciated. ABDOMINAL: Soft, nontender, nondistended, positive bowel sounds in all 4 quadrants no apparent ascites.  EXTREMITIES: No peripheral edema, 2+ DP and PT pulses.  HEMATOLOGIC: No significant bruising.  NEUROLOGIC: Oriented to person, place, and time. Nonfocal. Normal muscle tone.  PSYCHIATRIC: Normal mood and  affect. Normal behavior. Cooperative  CARDIAC DATABASE: EKG: 11/27/2021: NSR, 70 bpm, consider old inferior infarct, PRWP, without underlying injury pattern.  Echocardiogram: 10/17/2020:  1. Left ventricular ejection fraction, by estimation, is 55 to 60%. The left ventricle has normal function. The left ventricle has no regional wall motion abnormalities. Left ventricular diastolic parameters were normal. The average left ventricular  global longitudinal strain is -14.6 %. The global longitudinal strain is abnormal.   2. Right ventricular systolic function is mildly reduced. The right ventricular size is normal.   3. The mitral valve is normal in structure. No evidence of mitral valve regurgitation. No evidence of mitral stenosis.   4. The aortic valve is tricuspid. Aortic valve regurgitation is not visualized. Mild aortic valve sclerosis is present, with no evidence of aortic valve stenosis.   5. The inferior vena cava is normal in size with greater than 50% respiratory variability, suggesting right atrial pressure of 3 mmHg.   Stress Testing: Exercise Sestamibi Stress Test 10/14/2020: Normal ECG stress. patient exercised for 8 minutes and 0 seconds of a Bruce protocol, achieving approximately 10.13 METs. Achieved 107% of the maximum predicted heart rate response. No symptoms. Myocardial  perfusion is normal.  Overall LV systolic function is normal without regional wall motion abnormalities. Stress LV EF: 58%.  No previous exam available for comparison. Low risk  Coronary calcium scoring 11/09/2020: Total coronary artery calcium score 0 Scattered atherosclerosis in the descending thoracic aorta.  Heart Catheterization: None  LABORATORY DATA: External Labs: Collected: 09/11/2021. Total cholesterol 162, triglycerides 93, HDL 48, LDL 97. TSH 3.25 BUN 14, creatinine 0.63. Sodium 141, potassium 4.2, chloride 105, bicarb 23. AST 21, ALT 18, alkaline phosphatase 68. Hemoglobin 13.6 g/dL,  hematocrit 40.3%  LDL: January 2019 130 mg/dL. October 2019 125 mg/dL. December 2020 158 mg/dL. December 2021 140 mg/dL. December 2022 97 mg/dL   IMPRESSION:    ICD-10-CM   1. Pure hypercholesterolemia  E78.00     2. Nonspecific abnormal electrocardiogram (ECG) (EKG)  R94.31 EKG 12-Lead    3. Hx of breast cancer  Z85.3     4. Former smoker  Z87.891        RECOMMENDATIONS: Connie Ochoa is a 61 y.o. female whose past medical history and cardiac risk factors include: Hx of Left Breast Cancer s/p bilateral mastectomy & finished chemotherapy 2002.    Patient presents today for annual follow-up visit for management of hypercholesterolemia.  Since last office visit patient has been started on Mounjaro by her primary care provider and with lifestyle changes has lost successfully 42 pounds.  She recently had outside labs done which were reviewed independently and noted above for further reference.  Her LDL levels have come down nicely from 140 mg/dL to 97 mg/dL.   Reviewed the last echo and stress test as part of today's office visit to help with medical decision making.  EKG independently reviewed and interpreted as noted above.   No additional medication changes warranted at this time.  Would recommend annual follow-up visits after her yearly physical with her PCP for longitudinal follow-up care.  If her hyperlipidemia will be followed by her primary care provider I will see her on a as needed basis.  FINAL MEDICATION LIST END OF ENCOUNTER: No orders of the defined types were placed in this encounter.    Current Outpatient Medications:    ALPRAZolam (XANAX) 0.25 MG tablet, Take 0.125-0.25 mg by mouth daily as needed for anxiety. , Disp: , Rfl:    Aspirin-Acetaminophen-Caffeine (GOODY HEADACHE PO), Take 1 packet by mouth daily as needed (for pain)., Disp: , Rfl:    B Complex-C (B-COMPLEX WITH VITAMIN C) tablet, Take 1 tablet by mouth daily., Disp: , Rfl:    magnesium 30 MG  tablet, Take 1 tablet by mouth daily., Disp: , Rfl:    OVER THE COUNTER MEDICATION, Place 1 drop into both eyes as needed (for contacts). "Blink Contacts", Disp: , Rfl:    tirzepatide (MOUNJARO) 15 MG/0.5ML Pen, Inject 15 mg into the skin once a week., Disp: , Rfl:    tretinoin (RETIN-A) 5.170 % cream, 1 application. as needed., Disp: , Rfl:    valACYclovir (VALTREX) 1000 MG tablet, Take 2 tablets by mouth every 12 (twelve) hours. Cold sore outbreak., Disp: , Rfl: 1   Vitamin D, Ergocalciferol, (DRISDOL) 50000 units CAPS capsule, Take 1 capsule by mouth every 7 (seven) days., Disp: , Rfl: 0   PARoxetine (PAXIL) 10 MG tablet, Take 10 mg by mouth daily., Disp: , Rfl:   Orders Placed This Encounter  Procedures   EKG 12-Lead    There are no Patient Instructions on file for this visit.   --Continue cardiac medications as  reconciled in final medication list. --Return in about 1 year (around 11/28/2022) for Follow up HLD.. Or sooner if needed. --Continue follow-up with your primary care physician regarding the management of your other chronic comorbid conditions.  Patient's questions and concerns were addressed to her satisfaction. She voices understanding of the instructions provided during this encounter.   This note was created using a voice recognition software as a result there may be grammatical errors inadvertently enclosed that do not reflect the nature of this encounter. Every attempt is made to correct such errors.  Total time: 31 minutes.   Connie Ochoa, [place], [facility]  Pager: [phone] Office: [phone]

## 2022-04-17 ENCOUNTER — Encounter (HOSPITAL_BASED_OUTPATIENT_CLINIC_OR_DEPARTMENT_OTHER): Payer: Self-pay | Admitting: Emergency Medicine

## 2022-04-17 ENCOUNTER — Emergency Department (HOSPITAL_BASED_OUTPATIENT_CLINIC_OR_DEPARTMENT_OTHER)
Admission: EM | Admit: 2022-04-17 | Discharge: 2022-04-17 | Disposition: A | Discharge status: Home / Self Care | Payer: BC Managed Care – PPO | Attending: Emergency Medicine | Admitting: Emergency Medicine

## 2022-04-17 ENCOUNTER — Emergency Department (HOSPITAL_BASED_OUTPATIENT_CLINIC_OR_DEPARTMENT_OTHER): Payer: BC Managed Care – PPO

## 2022-04-17 ENCOUNTER — Other Ambulatory Visit: Payer: Self-pay

## 2022-04-17 DIAGNOSIS — S0181XA Laceration without foreign body of other part of head, initial encounter: Secondary | ICD-10-CM | POA: Diagnosis not present

## 2022-04-17 DIAGNOSIS — W228XXA Striking against or struck by other objects, initial encounter: Secondary | ICD-10-CM | POA: Diagnosis not present

## 2022-04-17 DIAGNOSIS — S0990XA Unspecified injury of head, initial encounter: Secondary | ICD-10-CM

## 2022-04-17 DIAGNOSIS — Z23 Encounter for immunization: Secondary | ICD-10-CM | POA: Diagnosis not present

## 2022-04-17 MED ORDER — LIDOCAINE-EPINEPHRINE-TETRACAINE (LET) TOPICAL GEL
3.0000 mL | Freq: Once | TOPICAL | Status: AC
Start: 2022-04-17 — End: 2022-04-17
  Administered 2022-04-17: 3 mL via TOPICAL
  Filled 2022-04-17: qty 3

## 2022-04-17 MED ORDER — LIDOCAINE HCL (PF) 1 % IJ SOLN
5.0000 mL | Freq: Once | INTRAMUSCULAR | Status: AC
  Administered 2022-04-17: 5 mL
  Filled 2022-04-17: qty 5

## 2022-04-17 MED ORDER — TETANUS-DIPHTH-ACELL PERTUSSIS 5-2.5-18.5 LF-MCG/0.5 IM SUSY
0.5000 mL | PREFILLED_SYRINGE | Freq: Once | INTRAMUSCULAR | Status: AC
  Administered 2022-04-17: 0.5 mL via INTRAMUSCULAR
  Filled 2022-04-17: qty 0.5

## 2022-04-17 MED ORDER — OXYCODONE HCL 5 MG PO TABS
5.0000 mg | ORAL_TABLET | ORAL | Status: AC
  Administered 2022-04-17: 5 mg via ORAL
  Filled 2022-04-17: qty 1

## 2022-04-17 NOTE — ED Notes (Signed)
Reviewed AVS/discharge instruction with patient. Time allotted for and all questions answered. Patient is agreeable for d/c and escorted to ed exit by staff.  

## 2022-04-17 NOTE — ED Notes (Signed)
Dressing applied. 

## 2022-04-17 NOTE — ED Provider Notes (Signed)
[facility] EMERGENCY DEPT Provider Note   CSN: [id number] Arrival date & time: 04/17/22  1544     History Chief Complaint  Patient presents with   Head Injury    Connie Ochoa is a 61 y.o. female presents the emergency department for evaluation after head injury earlier today.  The patient was at the gym using some elastic bands on a piece of metal when the bar fell and hit her on the head.  She denies any LOC.  No blood thinner use.  She reports a headache.  Denies any blurry vision.  Tetanus was last updated in 2014.  Denies any neck pain. Denies any other pain.    Head Injury Associated symptoms: headache   Associated symptoms: no neck pain and no numbness        Home Medications Prior to Admission medications   Medication Sig Start Date End Date Taking? Authorizing Provider  ALPRAZolam (XANAX) 0.25 MG tablet Take 0.125-0.25 mg by mouth daily as needed for anxiety.  01/24/14   [provider]  Aspirin-Acetaminophen-Caffeine (GOODY HEADACHE PO) Take 1 packet by mouth daily as needed (for pain).    [provider]  B Complex-C (B-COMPLEX WITH VITAMIN C) tablet Take 1 tablet by mouth daily.    [provider]  magnesium 30 MG tablet Take 1 tablet by mouth daily.    [provider]  OVER THE COUNTER MEDICATION Place 1 drop into both eyes as needed (for contacts). "Blink Contacts"    [provider]  tirzepatide Darcel Bayley) 15 MG/0.5ML Pen Inject 15 mg into the skin once a week.    [provider]  tretinoin (RETIN-A) 7.001 % cream 1 application. as needed. 02/24/18   [provider]  valACYclovir (VALTREX) 1000 MG tablet Take 2 tablets by mouth every 12 (twelve) hours. Cold sore outbreak. 08/31/15   [provider]  Vitamin D, Ergocalciferol, (DRISDOL) 50000 units CAPS capsule Take 1 capsule by mouth every 7 (seven) days. 09/27/15   [provider]      Allergies    Shrimp [shellfish  allergy]    Review of Systems   Review of Systems  Musculoskeletal:  Negative for neck pain.  Skin:  Positive for wound.  Neurological:  Positive for headaches. Negative for dizziness, syncope, weakness and numbness.    Physical Exam Updated Vital Signs BP 110/80   Pulse 69   Temp 98.6 F (37 C)   Resp 16   SpO2 100%  Physical Exam Vitals and nursing note reviewed.  Constitutional:      General: She is not in acute distress.    Appearance: Normal appearance. She is not ill-appearing or toxic-appearing.  HENT:     Head: Normocephalic.     Comments: No step-offs or deformities to the scalp.  Approximately 2.5 cm laceration noted to the frontal scalp.  Please see picture for additional detail.  Bleeding is controlled.  The patient has an area of contusion just inferior to her laceration.  No battle signs or raccoon eyes.    Nose: Nose normal.     Comments: Nontender to palpation.    Mouth/Throat:     Mouth: Mucous membranes are moist.  Eyes:     General: No scleral icterus.    Extraocular Movements: Extraocular movements intact.     Pupils: Pupils are equal, round, and reactive to light.  Neck:     Comments: No midline and paraspinal tenderness.  No step-offs or deformities.  No erythema or  increased warmth.  No signs of trauma. Cardiovascular:     Rate and Rhythm: Normal rate and regular rhythm.  Pulmonary:     Effort: Pulmonary effort is normal. No respiratory distress.     Breath sounds: Normal breath sounds.  Musculoskeletal:        General: No deformity.     Cervical back: Normal range of motion. No tenderness.  Skin:    General: Skin is warm and dry.  Neurological:     General: No focal deficit present.     Mental Status: She is alert and oriented to person, place, and time. Mental status is at baseline.     GCS: GCS eye subscore is 4. GCS verbal subscore is 5. GCS motor subscore is 6.     Cranial Nerves: No cranial nerve deficit, dysarthria or facial asymmetry.      Motor: No weakness or pronator drift.     Gait: Gait normal.        ED Results / Procedures / Treatments   Labs (all labs ordered are listed, but only abnormal results are displayed) Labs Reviewed - No data to display  EKG None  Radiology CT Head Wo Contrast  Result Date: 04/17/2022 CLINICAL DATA:  Head trauma, moderate-severe EXAM: CT HEAD WITHOUT CONTRAST TECHNIQUE: Contiguous axial images were obtained from the base of the skull through the vertex without intravenous contrast. RADIATION DOSE REDUCTION: This exam was performed according to the departmental dose-optimization program which includes automated exposure control, adjustment of the mA and/or kV according to patient size and/or use of iterative reconstruction technique. COMPARISON:  CT head November 10, 2015. FINDINGS: Brain: No evidence of acute infarction, hemorrhage, hydrocephalus, extra-axial collection or mass lesion/mass effect. Mild cerebellar tonsillar ectopia, probably not meeting measurement criteria for a Chiari malformation. Vascular: No hyperdense vessel identified. Skull: No acute fracture. Sinuses/Orbits: Visualized sinuses are clear. No acute orbital findings. Other: No mastoid effusions. IMPRESSION: 1. No evidence of acute intracranial abnormality. 2. Mild cerebellar tonsillar ectopia, probably not meeting measurement criteria for a Chiari malformation. An MRI could further quantify if clinically warranted. Electronically Signed   By: Margaretha Sheffield M.D.   On: 04/17/2022 19:11    Procedures .Marland KitchenLaceration Repair  Date/Time: 04/19/2022 10:05 AM  Performed by: Sherrell Puller, PA-C Authorized by: Sherrell Puller, PA-C   Consent:    Consent obtained:  Verbal   Consent given by:  Patient   Risks, benefits, and alternatives were discussed: yes     Risks discussed:  Infection, need for additional repair, pain, poor cosmetic result, poor wound healing and retained foreign body   Alternatives discussed:  No  treatment and delayed treatment Universal protocol:    Procedure explained and questions answered to patient or proxy's satisfaction: yes     Imaging studies available: yes     Patient identity confirmed:  Verbally with patient Anesthesia:    Anesthesia method:  Local infiltration and topical application   Topical anesthetic:  LET   Local anesthetic:  Lidocaine 1% w/o epi Laceration details:    Location:  Face   Face location:  Forehead   Length (cm):  2.5 Pre-procedure details:    Preparation:  Patient was prepped and draped in usual sterile fashion Exploration:    Wound exploration: wound explored through full range of motion and entire depth of wound visualized     Contaminated: no   Treatment:    Area cleansed with:  Shur-Clens and saline   Amount of cleaning:  Standard  Irrigation solution:  Sterile saline   Irrigation method:  Syringe and tap Skin repair:    Repair method:  Sutures   Suture size:  6-0   Suture material:  Prolene   Suture technique:  Simple interrupted   Number of sutures:  3 Approximation:    Approximation:  Close Repair type:    Repair type:  Simple Post-procedure details:    Dressing:  Non-adherent dressing   Procedure completion:  Tolerated well, no immediate complications    Medications Ordered in ED Medications  oxyCODONE (Oxy IR/ROXICODONE) immediate release tablet 5 mg (5 mg Oral Given 04/17/22 2007)  Tdap (BOOSTRIX) injection 0.5 mL (0.5 mLs Intramuscular Given 04/17/22 2008)  lidocaine-EPINEPHrine-tetracaine (LET) topical gel (3 mLs Topical Given 04/17/22 2010)  lidocaine (PF) (XYLOCAINE) 1 % injection 5 mL (5 mLs Other Given by Other 04/17/22 2213)    ED Course/ Medical Decision Making/ A&P                           Medical Decision Making Amount and/or Complexity of Data Reviewed Radiology: ordered.  Risk Prescription drug management.   61 year old female presents the emergency room for evaluation of head laceration.   Differential diagnosis includes was limited to intracranial bleed, contusion, hematoma, skull fracture, laceration.  Vital signs are normal.  Physical exam as mentioned above.  We will order CT imaging to rule any fractures.   CT shows no evidence of any acute intracranial abnormality.  There is some mild cerebellar tonsillar ectopia, probably not meeting measurement criteria for Chiari malformation, but an MRI could further quantify if clinically warranted.  Patient does not have any of the symptoms today.  She can follow-up outpatient with neurosurgery.  Updated her tetanus shot.    Patient request for plastic surgery to repair her forehead.  I discussed with her that there is no plastic surgeon on-call to come repair her forehead in the emergency department and that she can follow-up outpatient.  The patient would not like to follow with outpatient and would like for me to repair her forehead.  We will order LET for her initial analgesia.  Also ordered some oxycodone for her pain management.  Her husband is driving home today.  Please see procedure note for additional information. The area was not contaminated. I do not think any antibiotics are needed.   Reviewed laceration care and follow-up with concussion clinic with the patient.  We also discussed following up with neurosurgery due to the Chiari malformation possibly sooner CT scan given her family history since her sister has Chiari malformation as well. Strict return precautions and red flags symptoms were discussed. The patient verbalized understanding and agrees to the plan.   I discussed this case with my attending physician who cosigned this note including patient's presenting symptoms, physical exam, and planned diagnostics and interventions. Attending physician stated agreement with plan or made changes to plan which were implemented.   Final Clinical Impression(s) / ED Diagnoses Final diagnoses:  Laceration of forehead, initial  encounter    Rx / DC Orders ED Discharge Orders     None         Sherrell Puller, PA-C 04/19/22 [place], DO 04/21/22 1507

## 2022-04-17 NOTE — ED Triage Notes (Signed)
Using elastic bands on a metal pole. Pole gave and hit patient in forehead. Lac (about 3cm) to forehead. Denies LOC. Bleeding controlled reports having a bad headache at this time. Wound cleaned and covered

## 2022-04-17 NOTE — Discharge Instructions (Addendum)
You were seen in the ER for evaluation after your head injury.  We were able to repair the laceration to your forehead.  Your CT did not show any signs of trauma, however did show some signs of possible Chiari malformation.  Due to your family history of this, I have included the information for a neurosurgeon for you to follow-up with.  The information is attached to this discharge paperwork, you will need to call to schedule an appointment.  We have placed some sutures in your forehead to repair the laceration.  You will need to follow-up with urgent care, your PCP, or the emergency department in 5 days for suture removal.  We have updated your tetanus while you are here.  Additionally, I have included information for the [facility] concussion clinic.  Please call to schedule an appointment.  I included initial information on head injuries and laceration care to discharge paperwork.  You can take Tylenol or ibuprofen as needed for pain.  If you have any concern, new or worsening symptoms, please return to the nearest emergency department for reevaluation.  Get help right away if: You have: A very bad headache that is not helped by medicine. Trouble walking or weakness in your arms and legs. Clear or bloody fluid coming from your nose or ears. Changes in how you see (vision). A seizure. More confusion or more grumpy moods. Your symptoms get worse. You are sleepier than normal and have trouble staying awake. You lose your balance. The black centers of your eyes (pupils) change in size. Your speech is slurred. Your dizziness gets worse. You vomit. These symptoms may be an emergency. Do not wait to see if the symptoms will go away. Get medical help right away. Call your local emergency services (911 in the [place] Do not drive yourself to the hospital.

## 2022-12-24 IMAGING — CT CT CARDIAC CORONARY ARTERY CALCIUM SCORE
2 series · 16 of 20 positions shown, 18 images · non-contrast
Comparison: None.
COMPARISON: None.

Addendum:
EXAM:
OVER-READ INTERPRETATION  CT CHEST

The following report is an over-read performed by radiologist Dr.
Gleicia Neguim [REDACTED] on 11/09/2020. This over-read
does not include interpretation of cardiac or coronary anatomy or
pathology. The coronary calcium score interpretation by the
cardiologist is attached.
CLINICAL DATA: Risk stratification, CAD eval, asymptomatic, family
hx
Coronary Calcium Score
TECHNIQUE: The patient was scanned on a Siemens Force scanner. Axial
non-contrast 3 mm slices were carried out through the heart. The
data set was analyzed on a dedicated work station and scored using
the Agatston method.

[Series 3: cascseq 2.0 b35f 70% · axial · 0.39mm/px · z∈[+1085,+1191]mm · 8 of 69 slices shown]
[im 8/69  vessel]
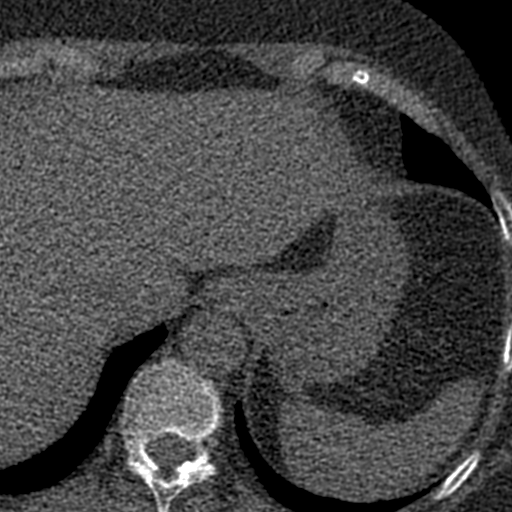
[im 16/69  vessel]
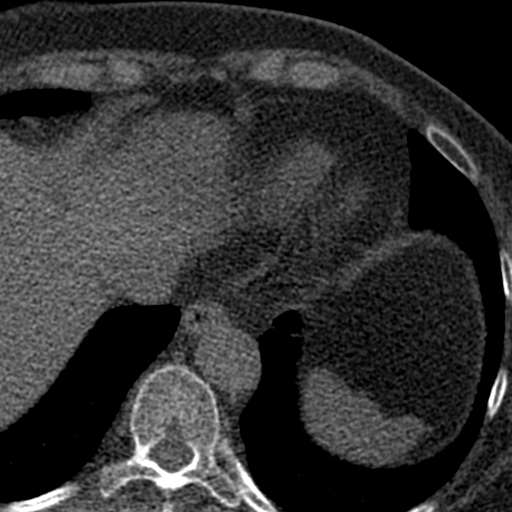
[im 23/69  vessel]
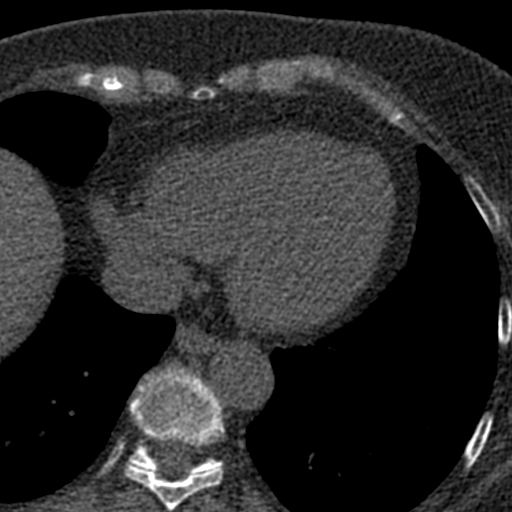
[im 31/69  vessel]
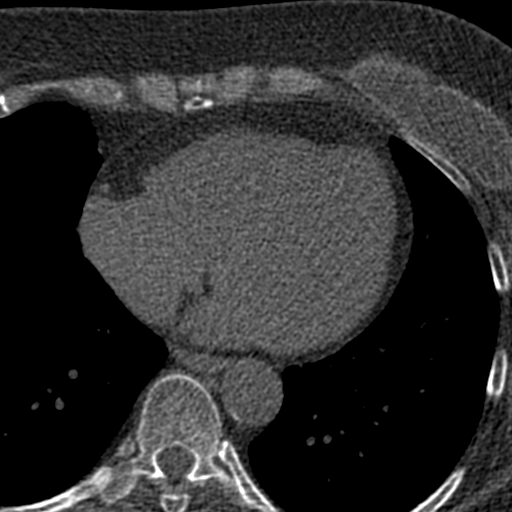
[im 38/69  vessel]
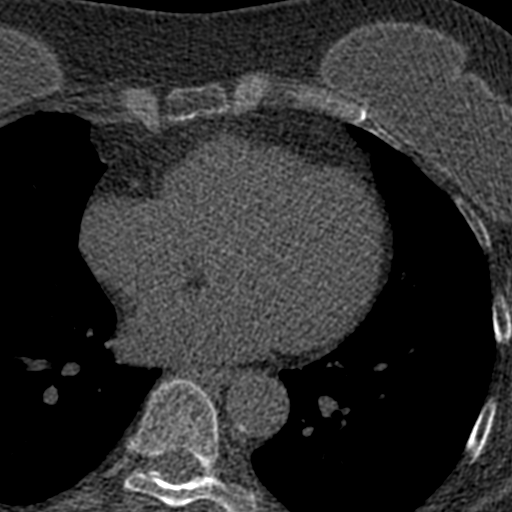
[im 46/69  vessel]
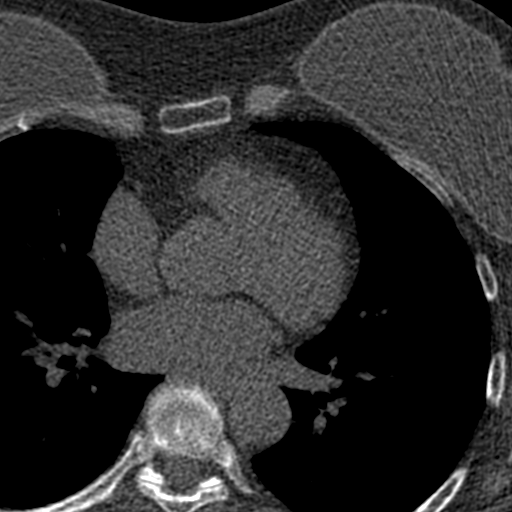
[im 53/69  vessel]
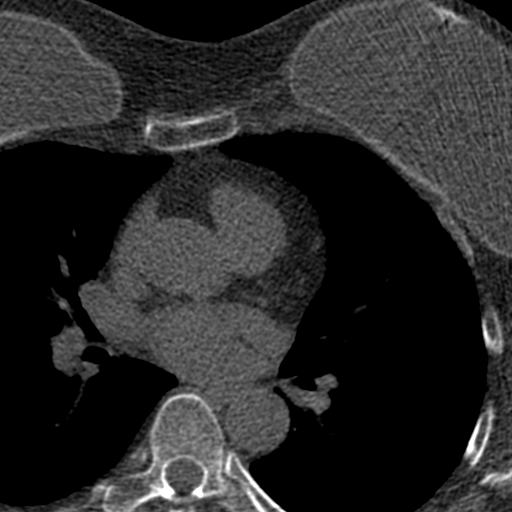
[im 61/69  vessel]
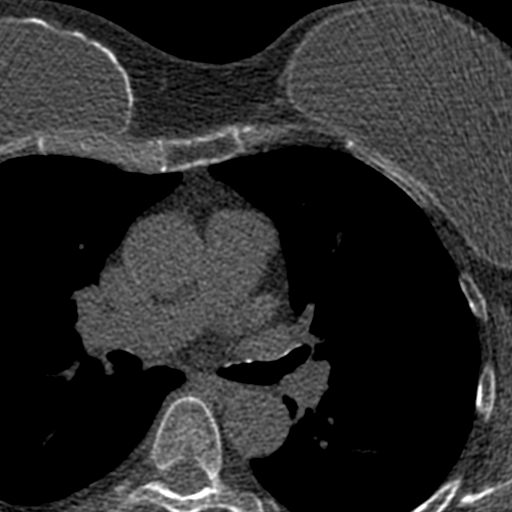

[Series 4: ax st full fov · axial · 0.65mm/px · z∈[+1085,+1191]mm · 8 of 69 slices shown, 10 images]
[im 8/69  vessel]
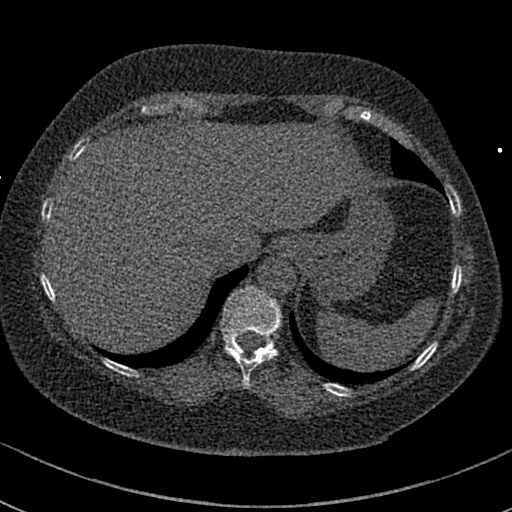
[im 8/69  lung]
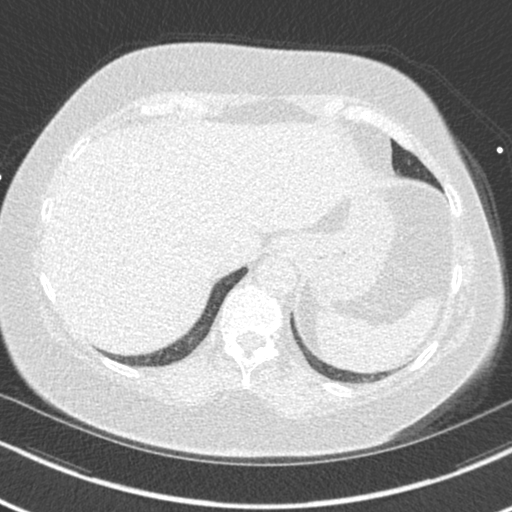
[im 16/69  vessel]
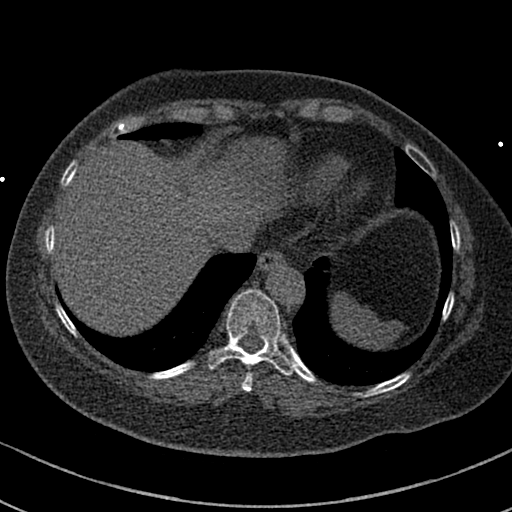
[im 23/69  vessel]
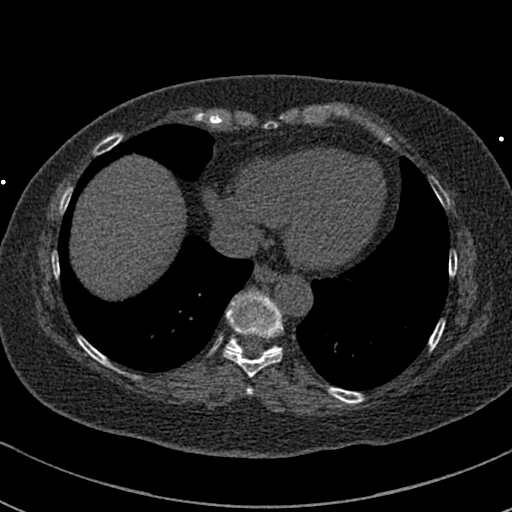
[im 31/69  vessel]
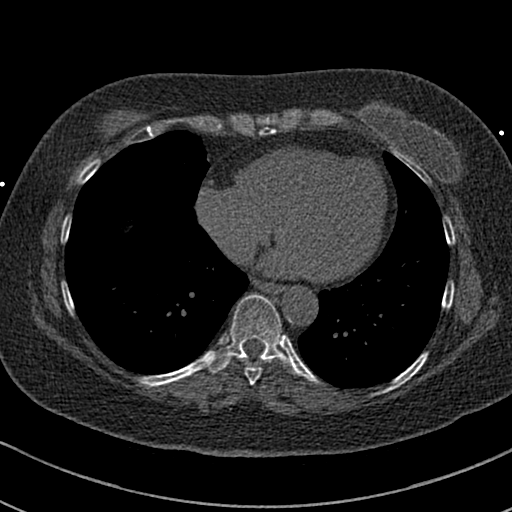
[im 38/69  vessel]
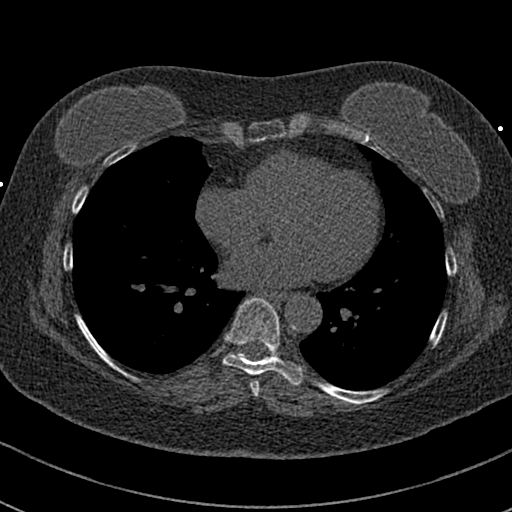
[im 38/69  lung]
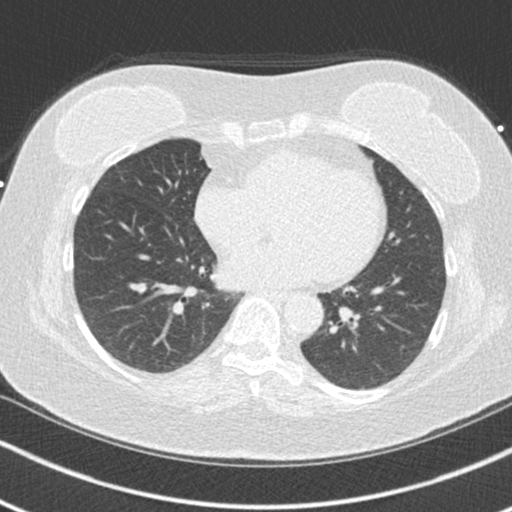
[im 46/69  vessel]
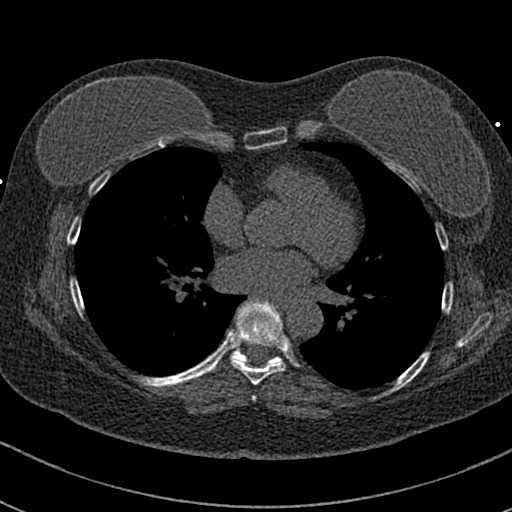
[im 53/69  vessel]
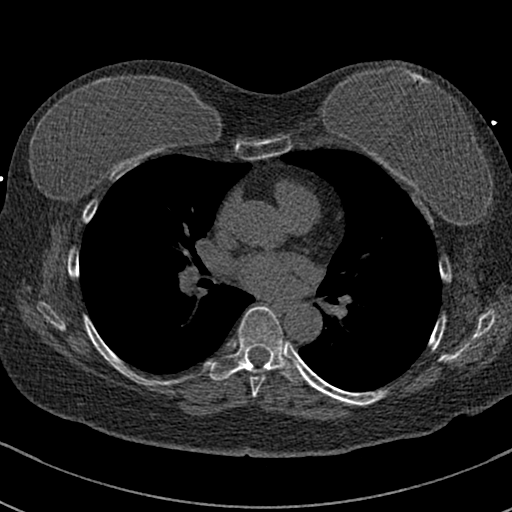
[im 61/69  vessel]
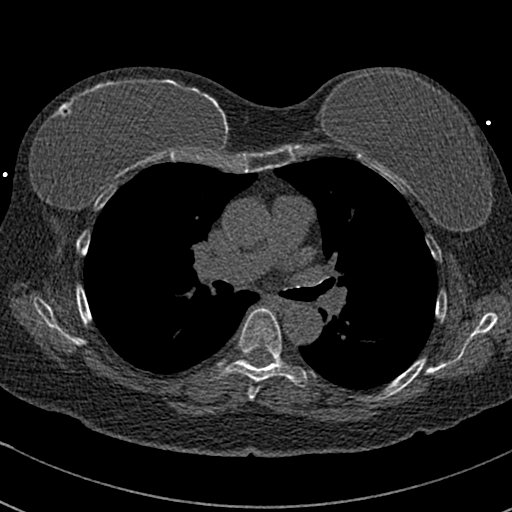

[16 of 20 positions shown; findings below may reference images not displayed]

FINDINGS: Vascular: Heart is normal size. Aorta normal caliber. Scattered
calcifications in the descending thoracic aorta.

Mediastinum/Nodes: No adenopathy

Lungs/Pleura: Visualized lungs clear.  No effusions

Upper Abdomen: Imaging into the upper abdomen demonstrates no acute
findings.

Musculoskeletal: Bilateral breast implants. No acute bony
abnormality.
IMPRESSION: Scattered atherosclerosis in the descending thoracic aorta.

No acute findings.
FINDINGS: Non-cardiac: See separate report from [REDACTED].

Ascending Aorta: 30 mm at the mid ascending aorta measured in an
axial plane.

Pericardium: Normal

Coronary arteries:

Total coronary calcium score of 0.
IMPRESSION: Total coronary calcium score of 0.

Aortic atherosclerosis within descending aorta.

*** End of Addendum ***
EXAM:
OVER-READ INTERPRETATION  CT CHEST

The following report is an over-read performed by radiologist Dr.
Gleicia Neguim [REDACTED] on 11/09/2020. This over-read
does not include interpretation of cardiac or coronary anatomy or
pathology. The coronary calcium score interpretation by the
cardiologist is attached.
FINDINGS: Vascular: Heart is normal size. Aorta normal caliber. Scattered
calcifications in the descending thoracic aorta.

Mediastinum/Nodes: No adenopathy

Lungs/Pleura: Visualized lungs clear.  No effusions

Upper Abdomen: Imaging into the upper abdomen demonstrates no acute
findings.

Musculoskeletal: Bilateral breast implants. No acute bony
abnormality.
IMPRESSION: Scattered atherosclerosis in the descending thoracic aorta.

No acute findings.

## 2024-05-20 ENCOUNTER — Ambulatory Visit
Admission: RE | Admit: 2024-05-20 | Discharge: 2024-05-20 | Disposition: A | Discharge status: Home / Self Care | Source: Ambulatory Visit | Attending: Family Medicine | Admitting: Family Medicine

## 2024-05-20 ENCOUNTER — Other Ambulatory Visit: Payer: Self-pay | Admitting: Family Medicine

## 2024-05-20 DIAGNOSIS — N63 Unspecified lump in unspecified breast: Secondary | ICD-10-CM

## 2024-05-20 DIAGNOSIS — N6311 Unspecified lump in the right breast, upper outer quadrant: Secondary | ICD-10-CM

## 2024-05-20 HISTORY — DX: Personal history of antineoplastic chemotherapy: Z92.21
